# Patient Record
Sex: Female | Born: 1940 | Race: Black or African American | Hispanic: No | Marital: Married | Smoking: Current every day smoker
Health system: Southern US, Community
[De-identification: ages and names within clinical notes are randomized; demographics above are authoritative.]

## PROBLEM LIST (undated history)

## (undated) DIAGNOSIS — M1990 Unspecified osteoarthritis, unspecified site: Secondary | ICD-10-CM

## (undated) DIAGNOSIS — E785 Hyperlipidemia, unspecified: Secondary | ICD-10-CM

## (undated) DIAGNOSIS — I10 Essential (primary) hypertension: Secondary | ICD-10-CM

## (undated) HISTORY — PX: REDUCTION MAMMAPLASTY: SUR839

## (undated) HISTORY — PX: COLONOSCOPY: SHX174

## (undated) HISTORY — PX: FRACTURE SURGERY: SHX138

## (undated) HISTORY — PX: BACK SURGERY: SHX140

## (undated) HISTORY — PX: ABDOMINAL HYSTERECTOMY: SHX81

---

## 1998-08-04 ENCOUNTER — Ambulatory Visit (HOSPITAL_COMMUNITY): Admission: RE | Admit: 1998-08-04 | Discharge: 1998-08-04 | Payer: Self-pay | Admitting: Cardiology

## 1998-08-04 ENCOUNTER — Encounter: Payer: Self-pay | Admitting: Cardiology

## 1998-11-09 ENCOUNTER — Ambulatory Visit (HOSPITAL_BASED_OUTPATIENT_CLINIC_OR_DEPARTMENT_OTHER): Admission: RE | Admit: 1998-11-09 | Discharge: 1998-11-09 | Payer: Self-pay | Admitting: Specialist

## 1999-02-01 ENCOUNTER — Emergency Department (HOSPITAL_COMMUNITY): Admission: EM | Admit: 1999-02-01 | Discharge: 1999-02-01 | Payer: Self-pay | Admitting: Emergency Medicine

## 1999-04-09 ENCOUNTER — Encounter: Payer: Self-pay | Admitting: Specialist

## 1999-04-09 ENCOUNTER — Ambulatory Visit (HOSPITAL_COMMUNITY): Admission: RE | Admit: 1999-04-09 | Discharge: 1999-04-09 | Payer: Self-pay | Admitting: Specialist

## 1999-04-24 ENCOUNTER — Encounter: Payer: Self-pay | Admitting: Specialist

## 1999-04-24 ENCOUNTER — Ambulatory Visit (HOSPITAL_COMMUNITY): Admission: RE | Admit: 1999-04-24 | Discharge: 1999-04-24 | Payer: Self-pay | Admitting: Specialist

## 1999-05-08 ENCOUNTER — Encounter: Payer: Self-pay | Admitting: Specialist

## 1999-05-08 ENCOUNTER — Ambulatory Visit (HOSPITAL_COMMUNITY): Admission: RE | Admit: 1999-05-08 | Discharge: 1999-05-08 | Payer: Self-pay | Admitting: Specialist

## 1999-05-29 ENCOUNTER — Observation Stay (HOSPITAL_COMMUNITY): Admission: RE | Admit: 1999-05-29 | Discharge: 1999-05-30 | Payer: Self-pay | Admitting: Specialist

## 1999-05-29 ENCOUNTER — Encounter (INDEPENDENT_AMBULATORY_CARE_PROVIDER_SITE_OTHER): Payer: Self-pay | Admitting: Specialist

## 1999-05-29 ENCOUNTER — Encounter: Payer: Self-pay | Admitting: Specialist

## 1999-07-31 ENCOUNTER — Ambulatory Visit (HOSPITAL_COMMUNITY): Admission: RE | Admit: 1999-07-31 | Discharge: 1999-07-31 | Payer: Self-pay | Admitting: Specialist

## 2000-07-01 ENCOUNTER — Encounter: Admission: RE | Admit: 2000-07-01 | Discharge: 2000-07-01 | Payer: Self-pay | Admitting: Specialist

## 2000-07-01 ENCOUNTER — Encounter: Payer: Self-pay | Admitting: Specialist

## 2000-09-23 HISTORY — PX: FOOT ARTHRODESIS, MODIFIED MCBRIDE: SUR52

## 2000-10-02 ENCOUNTER — Ambulatory Visit (HOSPITAL_BASED_OUTPATIENT_CLINIC_OR_DEPARTMENT_OTHER): Admission: RE | Admit: 2000-10-02 | Discharge: 2000-10-02 | Payer: Self-pay | Admitting: *Deleted

## 2000-12-11 ENCOUNTER — Ambulatory Visit (HOSPITAL_BASED_OUTPATIENT_CLINIC_OR_DEPARTMENT_OTHER): Admission: RE | Admit: 2000-12-11 | Discharge: 2000-12-11 | Payer: Self-pay | Admitting: *Deleted

## 2001-07-03 ENCOUNTER — Ambulatory Visit (HOSPITAL_COMMUNITY): Admission: RE | Admit: 2001-07-03 | Discharge: 2001-07-03 | Payer: Self-pay | Admitting: Obstetrics & Gynecology

## 2002-06-23 ENCOUNTER — Ambulatory Visit (HOSPITAL_COMMUNITY): Admission: RE | Admit: 2002-06-23 | Discharge: 2002-06-23 | Payer: Self-pay | Admitting: Specialist

## 2003-06-27 ENCOUNTER — Ambulatory Visit (HOSPITAL_COMMUNITY): Admission: RE | Admit: 2003-06-27 | Discharge: 2003-06-27 | Payer: Self-pay | Admitting: Specialist

## 2003-06-27 ENCOUNTER — Encounter: Payer: Self-pay | Admitting: Specialist

## 2004-03-13 ENCOUNTER — Inpatient Hospital Stay (HOSPITAL_COMMUNITY): Admission: RE | Admit: 2004-03-13 | Discharge: 2004-03-19 | Payer: Self-pay | Admitting: Cardiology

## 2004-06-27 ENCOUNTER — Ambulatory Visit (HOSPITAL_COMMUNITY): Admission: RE | Admit: 2004-06-27 | Discharge: 2004-06-27 | Payer: Self-pay | Admitting: Cardiology

## 2004-09-19 ENCOUNTER — Encounter (HOSPITAL_COMMUNITY): Admission: RE | Admit: 2004-09-19 | Discharge: 2004-12-18 | Payer: Self-pay | Admitting: Cardiology

## 2004-10-24 ENCOUNTER — Ambulatory Visit (HOSPITAL_COMMUNITY): Admission: RE | Admit: 2004-10-24 | Discharge: 2004-10-24 | Payer: Self-pay | Admitting: Cardiology

## 2004-11-08 ENCOUNTER — Other Ambulatory Visit: Admission: RE | Admit: 2004-11-08 | Discharge: 2004-11-08 | Payer: Self-pay | Admitting: Interventional Radiology

## 2004-11-08 ENCOUNTER — Encounter: Admission: RE | Admit: 2004-11-08 | Discharge: 2004-11-08 | Payer: Self-pay | Admitting: Cardiology

## 2004-11-08 ENCOUNTER — Encounter (INDEPENDENT_AMBULATORY_CARE_PROVIDER_SITE_OTHER): Payer: Self-pay | Admitting: *Deleted

## 2005-01-18 ENCOUNTER — Observation Stay (HOSPITAL_COMMUNITY): Admission: RE | Admit: 2005-01-18 | Discharge: 2005-01-19 | Payer: Self-pay | Admitting: General Surgery

## 2005-01-18 ENCOUNTER — Encounter (INDEPENDENT_AMBULATORY_CARE_PROVIDER_SITE_OTHER): Payer: Self-pay | Admitting: Specialist

## 2005-03-29 ENCOUNTER — Encounter: Admission: RE | Admit: 2005-03-29 | Discharge: 2005-03-29 | Payer: Self-pay | Admitting: Cardiology

## 2005-06-28 ENCOUNTER — Ambulatory Visit (HOSPITAL_COMMUNITY): Admission: RE | Admit: 2005-06-28 | Discharge: 2005-06-28 | Payer: Self-pay | Admitting: Cardiology

## 2006-04-02 ENCOUNTER — Emergency Department (HOSPITAL_COMMUNITY): Admission: EM | Admit: 2006-04-02 | Discharge: 2006-04-02 | Payer: Self-pay | Admitting: Emergency Medicine

## 2006-04-04 ENCOUNTER — Encounter: Admission: RE | Admit: 2006-04-04 | Discharge: 2006-04-04 | Payer: Self-pay | Admitting: Cardiology

## 2006-06-30 ENCOUNTER — Ambulatory Visit (HOSPITAL_COMMUNITY): Admission: RE | Admit: 2006-06-30 | Discharge: 2006-06-30 | Payer: Self-pay | Admitting: Cardiology

## 2007-03-14 ENCOUNTER — Emergency Department (HOSPITAL_COMMUNITY): Admission: EM | Admit: 2007-03-14 | Discharge: 2007-03-14 | Payer: Self-pay | Admitting: Emergency Medicine

## 2007-07-03 ENCOUNTER — Ambulatory Visit (HOSPITAL_COMMUNITY): Admission: RE | Admit: 2007-07-03 | Discharge: 2007-07-03 | Payer: Self-pay | Admitting: Cardiology

## 2008-02-04 ENCOUNTER — Ambulatory Visit (HOSPITAL_BASED_OUTPATIENT_CLINIC_OR_DEPARTMENT_OTHER): Admission: RE | Admit: 2008-02-04 | Discharge: 2008-02-04 | Payer: Self-pay | Admitting: Orthopedic Surgery

## 2008-04-23 HISTORY — PX: SHOULDER ARTHROSCOPY: SHX128

## 2008-07-04 ENCOUNTER — Ambulatory Visit (HOSPITAL_COMMUNITY): Admission: RE | Admit: 2008-07-04 | Discharge: 2008-07-04 | Payer: Self-pay | Admitting: Obstetrics and Gynecology

## 2008-08-24 ENCOUNTER — Encounter: Admission: RE | Admit: 2008-08-24 | Discharge: 2008-08-24 | Payer: Self-pay | Admitting: Orthopedic Surgery

## 2008-09-23 HISTORY — PX: TOTAL HIP ARTHROPLASTY: SHX124

## 2009-04-17 ENCOUNTER — Encounter: Admission: RE | Admit: 2009-04-17 | Discharge: 2009-04-17 | Payer: Self-pay | Admitting: Orthopedic Surgery

## 2009-05-19 ENCOUNTER — Ambulatory Visit (HOSPITAL_COMMUNITY): Admission: RE | Admit: 2009-05-19 | Discharge: 2009-05-19 | Payer: Self-pay | Admitting: Orthopedic Surgery

## 2009-07-10 ENCOUNTER — Ambulatory Visit (HOSPITAL_COMMUNITY): Admission: RE | Admit: 2009-07-10 | Discharge: 2009-07-10 | Payer: Self-pay | Admitting: Specialist

## 2009-10-23 ENCOUNTER — Encounter: Admission: RE | Admit: 2009-10-23 | Discharge: 2009-10-23 | Payer: Self-pay | Admitting: Cardiology

## 2009-12-12 ENCOUNTER — Encounter: Admission: RE | Admit: 2009-12-12 | Discharge: 2009-12-12 | Payer: Self-pay | Admitting: Orthopedic Surgery

## 2010-01-30 ENCOUNTER — Encounter: Admission: RE | Admit: 2010-01-30 | Discharge: 2010-01-30 | Payer: Self-pay | Admitting: Orthopedic Surgery

## 2010-04-23 HISTORY — PX: TOTAL HIP ARTHROPLASTY: SHX124

## 2010-05-10 ENCOUNTER — Inpatient Hospital Stay (HOSPITAL_COMMUNITY)
Admission: RE | Admit: 2010-05-10 | Discharge: 2010-05-14 | Payer: Self-pay | Source: Home / Self Care | Admitting: Orthopedic Surgery

## 2010-08-23 ENCOUNTER — Ambulatory Visit (HOSPITAL_COMMUNITY)
Admission: RE | Admit: 2010-08-23 | Discharge: 2010-08-23 | Payer: Self-pay | Source: Home / Self Care | Admitting: Cardiology

## 2010-10-14 ENCOUNTER — Encounter: Payer: Self-pay | Admitting: Cardiology

## 2010-10-14 ENCOUNTER — Encounter: Payer: Self-pay | Admitting: Orthopedic Surgery

## 2010-10-15 ENCOUNTER — Encounter: Payer: Self-pay | Admitting: Orthopedic Surgery

## 2010-12-06 LAB — BASIC METABOLIC PANEL
BUN: 9 mg/dL (ref 6–23)
Calcium: 8.6 mg/dL (ref 8.4–10.5)
Calcium: 8.7 mg/dL (ref 8.4–10.5)
Creatinine, Ser: 0.96 mg/dL (ref 0.4–1.2)
GFR calc non Af Amer: 50 mL/min — ABNORMAL LOW (ref >60)
GFR calc non Af Amer: 58 mL/min — ABNORMAL LOW (ref >60)
GFR calc non Af Amer: 58 mL/min — ABNORMAL LOW (ref >60)
Glucose, Bld: 120 mg/dL — ABNORMAL HIGH (ref 70–99)
Glucose, Bld: 165 mg/dL — ABNORMAL HIGH (ref 70–99)
Potassium: 3.8 mEq/L (ref 3.5–5.1)
Potassium: 4.4 mEq/L (ref 3.5–5.1)
Sodium: 135 mEq/L (ref 135–145)
Sodium: 141 mEq/L (ref 135–145)

## 2010-12-06 LAB — CBC
HCT: 30.2 % — ABNORMAL LOW (ref 36.0–46.0)
HCT: 31.2 % — ABNORMAL LOW (ref 36.0–46.0)
HCT: 35 % — ABNORMAL LOW (ref 36.0–46.0)
Hemoglobin: 10.4 g/dL — ABNORMAL LOW (ref 12.0–15.0)
Hemoglobin: 10.9 g/dL — ABNORMAL LOW (ref 12.0–15.0)
Hemoglobin: 11.3 g/dL — ABNORMAL LOW (ref 12.0–15.0)
MCHC: 33.8 g/dL (ref 30.0–36.0)
MCHC: 34.2 g/dL (ref 30.0–36.0)
MCHC: 34.8 g/dL (ref 30.0–36.0)
RBC: 3.42 MIL/uL — ABNORMAL LOW (ref 3.87–5.11)
RDW: 13.8 % (ref 11.5–15.5)
RDW: 14 % (ref 11.5–15.5)
RDW: 14.1 % (ref 11.5–15.5)
WBC: 11.1 10*3/uL — ABNORMAL HIGH (ref 4.0–10.5)

## 2010-12-06 LAB — DIFFERENTIAL
Basophils Absolute: 0 10*3/uL (ref 0.0–0.1)
Lymphocytes Relative: 13 % (ref 12–46)
Monocytes Absolute: 0.8 10*3/uL (ref 0.1–1.0)
Monocytes Relative: 7 % (ref 3–12)
Neutro Abs: 8.8 10*3/uL — ABNORMAL HIGH (ref 1.7–7.7)
Neutrophils Relative %: 79 % — ABNORMAL HIGH (ref 43–77)

## 2010-12-06 LAB — URINALYSIS, MICROSCOPIC ONLY
Nitrite: NEGATIVE
Protein, ur: NEGATIVE mg/dL
Urobilinogen, UA: 0.2 mg/dL (ref 0.0–1.0)

## 2010-12-06 LAB — URINALYSIS, ROUTINE W REFLEX MICROSCOPIC
Glucose, UA: NEGATIVE mg/dL
Hgb urine dipstick: NEGATIVE
Protein, ur: NEGATIVE mg/dL
pH: 6 (ref 5.0–8.0)

## 2010-12-06 LAB — URINE CULTURE

## 2010-12-06 LAB — CROSSMATCH
ABO/RH(D): B POS
Antibody Screen: NEGATIVE

## 2010-12-07 LAB — COMPREHENSIVE METABOLIC PANEL
ALT: 26 U/L (ref 0–35)
AST: 27 U/L (ref 0–37)
Alkaline Phosphatase: 67 U/L (ref 39–117)
CO2: 25 mEq/L (ref 19–32)
GFR calc Af Amer: >60 mL/min (ref >60)
GFR calc non Af Amer: >60 mL/min (ref >60)
Glucose, Bld: 84 mg/dL (ref 70–99)
Potassium: 3.8 mEq/L (ref 3.5–5.1)
Sodium: 141 mEq/L (ref 135–145)

## 2010-12-07 LAB — CBC
HCT: 44.1 % (ref 36.0–46.0)
Hemoglobin: 15.2 g/dL — ABNORMAL HIGH (ref 12.0–15.0)
RBC: 4.79 MIL/uL (ref 3.87–5.11)
WBC: 7.4 10*3/uL (ref 4.0–10.5)

## 2010-12-07 LAB — PROTIME-INR
INR: 1.05 (ref 0.00–1.49)
Prothrombin Time: 13.9 seconds (ref 11.6–15.2)

## 2010-12-07 LAB — DIFFERENTIAL
Basophils Relative: 1 % (ref 0–1)
Eosinophils Absolute: 0.3 10*3/uL (ref 0.0–0.7)
Eosinophils Relative: 4 % (ref 0–5)
Lymphs Abs: 2.6 10*3/uL (ref 0.7–4.0)
Monocytes Relative: 7 % (ref 3–12)
Neutrophils Relative %: 52 % (ref 43–77)

## 2010-12-07 LAB — URINALYSIS, ROUTINE W REFLEX MICROSCOPIC
Glucose, UA: NEGATIVE mg/dL
Nitrite: NEGATIVE
Specific Gravity, Urine: 1.018 (ref 1.005–1.030)
pH: 5 (ref 5.0–8.0)

## 2010-12-07 LAB — SURGICAL PCR SCREEN: MRSA, PCR: NEGATIVE

## 2011-01-04 ENCOUNTER — Other Ambulatory Visit: Payer: Self-pay | Admitting: Orthopedic Surgery

## 2011-01-04 ENCOUNTER — Encounter (HOSPITAL_COMMUNITY): Payer: MEDICARE

## 2011-01-04 DIAGNOSIS — Z01818 Encounter for other preprocedural examination: Secondary | ICD-10-CM | POA: Insufficient documentation

## 2011-01-04 DIAGNOSIS — Z01812 Encounter for preprocedural laboratory examination: Secondary | ICD-10-CM | POA: Insufficient documentation

## 2011-01-04 LAB — PROTIME-INR: Prothrombin Time: 13.3 seconds (ref 11.6–15.2)

## 2011-01-04 LAB — COMPREHENSIVE METABOLIC PANEL
ALT: 19 U/L (ref 0–35)
Albumin: 4 g/dL (ref 3.5–5.2)
Calcium: 9.3 mg/dL (ref 8.4–10.5)
GFR calc Af Amer: >60 mL/min (ref >60)
Glucose, Bld: 81 mg/dL (ref 70–99)
Potassium: 3.5 mEq/L (ref 3.5–5.1)
Sodium: 138 mEq/L (ref 135–145)
Total Protein: 6.8 g/dL (ref 6.0–8.3)

## 2011-01-04 LAB — DIFFERENTIAL
Eosinophils Relative: 3 % (ref 0–5)
Lymphocytes Relative: 35 % (ref 12–46)
Monocytes Absolute: 0.6 10*3/uL (ref 0.1–1.0)
Monocytes Relative: 8 % (ref 3–12)
Neutro Abs: 4.2 10*3/uL (ref 1.7–7.7)

## 2011-01-04 LAB — URINE MICROSCOPIC-ADD ON

## 2011-01-04 LAB — SURGICAL PCR SCREEN: Staphylococcus aureus: POSITIVE — AB

## 2011-01-04 LAB — CBC
HCT: 43.3 % (ref 36.0–46.0)
Hemoglobin: 14.5 g/dL (ref 12.0–15.0)
MCV: 90 fL (ref 78.0–100.0)
RDW: 13.9 % (ref 11.5–15.5)
WBC: 7.9 10*3/uL (ref 4.0–10.5)

## 2011-01-04 LAB — URINALYSIS, ROUTINE W REFLEX MICROSCOPIC
Bilirubin Urine: NEGATIVE
Glucose, UA: NEGATIVE mg/dL
Hgb urine dipstick: NEGATIVE
Ketones, ur: NEGATIVE mg/dL
Specific Gravity, Urine: 1.025 (ref 1.005–1.030)
pH: 5.5 (ref 5.0–8.0)

## 2011-01-04 LAB — APTT: aPTT: 33 seconds (ref 24–37)

## 2011-01-10 ENCOUNTER — Inpatient Hospital Stay (HOSPITAL_COMMUNITY)
Admission: RE | Admit: 2011-01-10 | Discharge: 2011-01-16 | DRG: 470 | Disposition: A | Discharge status: Skilled Nursing Facility | Payer: MEDICARE | Source: Ambulatory Visit | Attending: Orthopedic Surgery | Admitting: Orthopedic Surgery

## 2011-01-10 ENCOUNTER — Inpatient Hospital Stay (HOSPITAL_COMMUNITY): Payer: MEDICARE

## 2011-01-10 DIAGNOSIS — N390 Urinary tract infection, site not specified: Secondary | ICD-10-CM | POA: Diagnosis present

## 2011-01-10 DIAGNOSIS — Z96649 Presence of unspecified artificial hip joint: Secondary | ICD-10-CM

## 2011-01-10 DIAGNOSIS — I10 Essential (primary) hypertension: Secondary | ICD-10-CM | POA: Diagnosis present

## 2011-01-10 DIAGNOSIS — E7800 Pure hypercholesterolemia, unspecified: Secondary | ICD-10-CM | POA: Diagnosis present

## 2011-01-10 DIAGNOSIS — M169 Osteoarthritis of hip, unspecified: Principal | ICD-10-CM | POA: Diagnosis present

## 2011-01-10 DIAGNOSIS — D62 Acute posthemorrhagic anemia: Secondary | ICD-10-CM | POA: Diagnosis not present

## 2011-01-10 DIAGNOSIS — Y831 Surgical operation with implant of artificial internal device as the cause of abnormal reaction of the patient, or of later complication, without mention of misadventure at the time of the procedure: Secondary | ICD-10-CM | POA: Diagnosis not present

## 2011-01-10 DIAGNOSIS — Z01812 Encounter for preprocedural laboratory examination: Secondary | ICD-10-CM

## 2011-01-10 DIAGNOSIS — T84049A Periprosthetic fracture around unspecified internal prosthetic joint, initial encounter: Secondary | ICD-10-CM | POA: Diagnosis not present

## 2011-01-10 DIAGNOSIS — F17200 Nicotine dependence, unspecified, uncomplicated: Secondary | ICD-10-CM | POA: Diagnosis present

## 2011-01-10 DIAGNOSIS — K5900 Constipation, unspecified: Secondary | ICD-10-CM | POA: Diagnosis not present

## 2011-01-10 DIAGNOSIS — M1610 Unilateral primary osteoarthritis, unspecified hip: Principal | ICD-10-CM | POA: Diagnosis present

## 2011-01-10 LAB — URINALYSIS, ROUTINE W REFLEX MICROSCOPIC
Hgb urine dipstick: NEGATIVE
Specific Gravity, Urine: 1.011 (ref 1.005–1.030)
Urobilinogen, UA: 0.2 mg/dL (ref 0.0–1.0)

## 2011-01-11 ENCOUNTER — Inpatient Hospital Stay (HOSPITAL_COMMUNITY): Payer: MEDICARE

## 2011-01-11 LAB — CBC
HCT: 32 % — ABNORMAL LOW (ref 36.0–46.0)
Hemoglobin: 10.8 g/dL — ABNORMAL LOW (ref 12.0–15.0)
MCH: 30.3 pg (ref 26.0–34.0)
MCHC: 33.8 g/dL (ref 30.0–36.0)
MCV: 89.6 fL (ref 78.0–100.0)

## 2011-01-11 LAB — BASIC METABOLIC PANEL
CO2: 28 mEq/L (ref 19–32)
Calcium: 8.6 mg/dL (ref 8.4–10.5)
Creatinine, Ser: 1.14 mg/dL (ref 0.4–1.2)
Glucose, Bld: 127 mg/dL — ABNORMAL HIGH (ref 70–99)

## 2011-01-11 LAB — URINE CULTURE: Culture: NO GROWTH

## 2011-01-12 ENCOUNTER — Inpatient Hospital Stay (HOSPITAL_COMMUNITY): Payer: MEDICARE

## 2011-01-12 LAB — CBC
HCT: 29.3 % — ABNORMAL LOW (ref 36.0–46.0)
Hemoglobin: 9.8 g/dL — ABNORMAL LOW (ref 12.0–15.0)
RDW: 14.1 % (ref 11.5–15.5)
WBC: 12.9 10*3/uL — ABNORMAL HIGH (ref 4.0–10.5)

## 2011-01-12 LAB — BASIC METABOLIC PANEL
CO2: 26 mEq/L (ref 19–32)
GFR calc Af Amer: >60 mL/min (ref >60)
GFR calc non Af Amer: >60 mL/min (ref >60)
Glucose, Bld: 118 mg/dL — ABNORMAL HIGH (ref 70–99)
Potassium: 3.4 mEq/L — ABNORMAL LOW (ref 3.5–5.1)
Sodium: 137 mEq/L (ref 135–145)

## 2011-01-13 LAB — CBC
HCT: 27.9 % — ABNORMAL LOW (ref 36.0–46.0)
Hemoglobin: 9.5 g/dL — ABNORMAL LOW (ref 12.0–15.0)
MCHC: 34.1 g/dL (ref 30.0–36.0)
RBC: 3.13 MIL/uL — ABNORMAL LOW (ref 3.87–5.11)

## 2011-01-13 LAB — URINALYSIS, ROUTINE W REFLEX MICROSCOPIC
Glucose, UA: 100 mg/dL — AB
Protein, ur: NEGATIVE mg/dL
Specific Gravity, Urine: 1.024 (ref 1.005–1.030)
pH: 5.5 (ref 5.0–8.0)

## 2011-01-13 LAB — URINE MICROSCOPIC-ADD ON

## 2011-01-14 LAB — CROSSMATCH: Unit division: 0

## 2011-01-22 NOTE — Discharge Summary (Signed)
[name], [name]   ACCOUNT NO.:  [id number]  MEDICAL RECORD NO.:  [id number]           PATIENT TYPE:  I  LOCATION:  1610                         FACILITY:  [facility]  PHYSICIAN:  Adyline Huberty L. Rendall, M.D.  DATE OF BIRTH:  Mar 27, 1941  DATE OF ADMISSION:  01/10/2011 DATE OF DISCHARGE:                              DISCHARGE SUMMARY   ADMISSION DIAGNOSES: 1. End-stage osteoarthritis right hip. 2. History of left total hip arthroplasty. 3. Hypertension. 4. Hypercholesterolemia.  DISCHARGE DIAGNOSES: 1. End-stage osteoarthritis, right hip, status post right total hip     arthroplasty. 2. Nondisplaced distal femur fracture distal to the prosthesis,     nondisplaced. 3. Acute blood loss anemia secondary to surgery. 4. Constipation, now resolved. 5. Preop and now postop urinary tract infection. 6. History of left total hip arthroplasty. 7. Hypertension. 8. Hypercholesterolemia.  SURGICAL PROCEDURES:  On January 10, 2011, [name] underwent a right total hip arthroplasty by Dr. Jonny Ruiz L. Rendall assisted by Arnoldo Morale PA-C.  She had a Pinnacle acetabular 100 series shell placed, 52 mm outer diameter, with an apex hole eliminator and a pinnacle marathon poly acetabular liner plus 4, 10 degrees, 36 mm inner diameter, 52 mm outer diameter.  A Prodigy femoral stem, 12/14 taper, small stature with Porocoat 16.5 mm right with a metal-on-metal femoral head 12/14 taper +5 neck length 36 mm.  COMPLICATIONS:  A small nondisplaced crack was noted in the distal femur just distal to the prosthesis on the postop films and was confirmed with repeat films.  CONSULTANT: 1. Physical Therapy consult on January 11, 2011. 2. Occupational Therapy consult on January 12, 2011. 3. Case management consult, January 12, 2011.  HISTORY OF PRESENT ILLNESS:  This 70 year old black female patient with a history of a left total hip last August presented with a 22-month history of sudden onset  progressive right hip pain without injury.  Pain is now constant, sharp sensation over the right trochanter and groin with some radiation into the knee.  Pain increases with prolonged walking and decreases with nothing.  The hip pops, gives way, she cannot lie on the right side.  She has failed conservative treatment and x-rays showed arthritic changes.  Because of this, she is presenting for a right hip replacement.  HOSPITAL COURSE:  [name] tolerated her surgical procedure without immediate postoperative complications.  On the postop x-ray, Radiology felt there might be a small nondisplaced crack at the distal femur.  We did look at the x-rays and it was unclear.  Repeat films were ordered over the next several days and it did show what appears to be a nondisplaced distal femur fracture line.  Prosthesis otherwise looks in good position.  On postop day #1, T max was 100.4, vitals were stable.  Hemoglobin 10.8, hematocrit 32.  Her repeat UA when the Foley was placed was negative fora UTI.  She had had one preoperatively treated with Macrobid.  She was weaned off her oxygen and switched to the p.o. Nucynta and started on therapy per protocol.  She did have difficulty with therapy that day, so her weightbearing status was switched to touchdown weightbearing.  On postop day #2, it was noted she  had some right knee pain and swelling.  X-rays were taken of the knee and hip.  The fracture of the distal femur was confirmed but there was no abnormality noted about the patella.  Her T-max was 101.3, hemoglobin 8.5, hematocrit 29.3.  She was continued on therapy but not much was done due to touchdown weightbearing status.  On postop day #3, she had continued mild fevers.  Nucynta was doing well for pain but a repeat UA was obtained.  It did show a possible re- emergence of UTI.  She was started on Septra and then switched back to her Macrobid.  Plans were made for possible  skilled placement.  The patient was not real receptive to that at first so an attempt was made to advancing therapy before discharge.  The next several days, she made very slow progress.  She still had a moderate amount of pain, when she got out of bed could do very little with therapy.  She is to be touchdown weightbearing and is able to ambulate a few feet at the time with the walker.  Her right hip incision was well approximated with staples, moderate serosanguineous drainage. She remains afebrile, vitals stable, tolerating Nucynta and Macrobid well.  It is felt she is ready for DC probably to a skilled facility later today.  DISCHARGE INSTRUCTIONS:  DIET:  She is to resume her regular diet.  MEDICATIONS:  Please see the home discharge med rec sheet for complete documentation of her medications.  She has multiple allergies.  New meds that we added were Robaxin, Macrobid, Xarelto, tapentadol for pain.  The rest for meds are noted on the discharge rec sheet.  ACTIVITY:  She is to be touchdown weightbearing only on the right leg with use of walker.  No twisting of the leg is to be done.  She is to have PT per rehab protocols.  No lifting or driving for 6 to 8 weeks.  Please see the white total joint discharge sheet for further activity instructions.  WOUND CARE:  If the Mepilex stays clean and intact, it can stay in place for another 2 to 3 days.  When that comes off, the incision is to be cleaned with Betadine and a dry dressing applied.  Notify Dr. Priscille Kluver of any signs of infection.  Please see the white total joint discharge sheet for further wound care instructions.  FOLLOWUP:  She is to follow up with Dr. Priscille Kluver in our office on Thursday Jan 24, 2011, and she needs to call [phone] for that appointment.  She is arranged to have [facility] at the time of discharge for her home health needs that has been arranged previously.  LABORATORY DATA:  Hemoglobin and  hematocrit ranged from 10.8 and 32 on the 20th to a low of 9.5 and 27.9 on the 22nd to 10.8 and 32 on the 20th.  White count went from 7.4 on the 20th to 13 on the 22nd. Platelets were within normal limits.  Potassium dropped to a low of 3.4 on the 21st.  Glucose ranged from 118 to 127.  BUN and creatinine were within normal limits and estimated GFR was low at 57 on the 20th and then went to greater than 60.  All other laboratory studies were within normal limits.  RADIOLOGIC FINDINGS:  X-rays taken of the right knee on the 21st showed no fracture or dislocation, little bit of degenerative spurring.  The right femur films on the 20th and also the  21st showed a probable linear lucency projecting distally from the distal stem of the right femoral implant, they feel this could be a nondisplaced fracture.  All other laboratory studies were within normal limits.     Legrand Pitts Duffy, P.A.   ______________________________ Carlisle Beers. Priscille Kluver, M.D.    KED/MEDQ  D:  01/15/2011  T:  01/15/2011  Job:  [id number]  Electronically Signed by Otilio Jefferson. on 01/17/2011 04:58:13 PM Electronically Signed by Erasmo Leventhal M.D. on 01/22/2011 01:08:07 PM

## 2011-01-22 NOTE — Op Note (Signed)
[name], [name]   ACCOUNT NO.:  [id number]  MEDICAL RECORD NO.:  [id number]           PATIENT TYPE:  I  LOCATION:  1610                         FACILITY:  [facility]  PHYSICIAN:  Laneah Luft L. Rendall, M.D.  DATE OF BIRTH:  1941/09/19  DATE OF PROCEDURE:  01/10/2011 DATE OF DISCHARGE:                              OPERATIVE REPORT   PREOPERATIVE DIAGNOSIS:  Osteoarthritis, right hip.  POSTOPERATIVE DIAGNOSIS:  Osteoarthritis, right hip.  SURGICAL PROCEDURE:  Right AML total hip arthroplasty.  SURGEON:  Peaches Vanoverbeke L. Rendall, M.D.  ASSISTANT:  Legrand Pitts. Duffy, P.A. present and participating in entire procedure.  PATHOLOGY:  Bone against bone, right hip.  Significant pain despite all conservative measures including injection.  JUSTIFICATION FOR INPATIENT SETTING:  Need for general anesthesia and therapy.  DESCRIPTION OF PROCEDURE:  Under general anesthetic, the right hip was prepared with DuraPrep and draped as a sterile field in a left lateral decubitus position.  Posterior approach was made splitting the IT band and the subcutaneous tissue in line of the IT band fibers.  A 4-inch fatty layer was encountered.  A deep Charnley retractor was inserted. The short external rotators and hip capsule were taken down from bone with electrocautery.  Care was taken to control bleeding.  Hip capsule was then opened in a T-shaped manner, and the hip was dislocated.  The superior femoral neck was exposed and an IM initiator and canal finder were used.  Progressive reaming up to 16 mm was then done.  The guide was then used and the femoral neck osteotomized.  Rasps were then used 13.5 small, 15 small and 16.5 small which gave excellent proximal and distal fit.  Calcar reamer had been used at the 15-mm rasp.  Attention was then turned to the acetabulum.  It was exposed inferiorly with 2 cobra retractors, superiorly with 2 wing retractors.  The labrum was excised.  Ligamentum teres was  excised.  Progressive reaming was then done 43, 45, 47, 49 and 51.  A 50 acetabular component bottomed out nicely and seemed to have relatively good rim fit.  Permanent component was then obtained, 52 mm, 100 series pinnacle cup and an excellent press fit was obtained using the spud neck external guide and also using the natural rim of the acetabulum as a secondary guide.  With this in place, a trial acetabular poly was then inserted. A +4, 10 degree with 36 mm inside, 52 mm outside diameter.  This was inserted.  A trial of hip ball off a rasp was then done and after several combinations were tried, the +5 neck length on a prodigy neck gave the best fit, alignment and stability through all normal range of motion.  Permanent components were then obtained, and the marathon polyethylene acetabular liner +4, 10 degree was then inserted after an apex hole eliminator.  Prodigy stem was then inserted.  Trial was then done again with the +5, 36-mm hip ball and with the real stem in place, an excellent fit was obtained, so the permanent metal hip ball was then inserted.  The hip was stable through full range of motion.  The wound was then irrigated and closed in layers.  The  labrum was reconstructed with #1 Tycron, the piriformis was reattached with #1 Tycron, loose attempt was made to reattach short external rotators and then attention was turned to the IT band which was closed with #1 Tycron.  The 4-inch fatty layer was closed in 3 layers with #1 Vicryl, 2 layers and 2-0 Vicryl, 1 layer and skin clips.  Operative time an hour and 10 minutes. Blood loss estimated at less than 350.  The patient tolerated the procedure well and returned to recovery in good condition.     Sulaiman Imbert L. Priscille Kluver, M.D.     Renato Gails  D:  01/10/2011  T:  01/10/2011  Job:  [id number]  Electronically Signed by Erasmo Leventhal M.D. on 01/22/2011 01:08:04 PM

## 2011-02-05 NOTE — Op Note (Signed)
[name], [name]   ACCOUNT NO.:  [id number]   MEDICAL RECORD NO.:  [id number]          PATIENT TYPE:  AMB   LOCATION:  DSC                          FACILITY:  [facility]   PHYSICIAN:  Rodney A. Mortenson, M.D.DATE OF BIRTH:  1940-09-25   DATE OF PROCEDURE:  02/04/2008  DATE OF DISCHARGE:                               OPERATIVE REPORT   JUSTIFICATION:  This is a 70 year old female who has had trouble of the  shoulder since January 2009.  She has followed with Dr. Burnadette Peter, treated  with subacromial injections.  These have been partially beneficial.  As  she increases her activities, these are no longer helpful.  She is  having significant pain with the shoulder now and an MRI was done.  This  shows significant rotator cuff tendinopathy and tendinosis.  There was  fairly extensive and deep bursal surface tears involving the  supraspinatus, although it does not look like a full-thickness tear.  This is type 3 acromion and spurring on the distal end of the clavicle  on inferior surface contributing to bony impingement.  She was now  admitted for arthroscopic evaluation and treatments.  Questions were  answered and encouraged preoperatively.   COMPLICATIONS:  As discussed.   Justification outpatient surgical, minimum morbidity.   PREOPERATIVE DIAGNOSES:  Severe cuff tendinopathy right shoulder with  extensive deep bursal surface tear, type 3 acromion with impingement,  and spurs in the inferior aspect of acromion in distal end of the  clavicle causing bony impingement and irritation of the cuff.   POSTOPERATIVE DIAGNOSES:  Severe cuff tendinopathy right shoulder with  extensive deep bursal surface tear, type 3 acromion with impingement,  and spurs in the inferior aspect of acromion in distal end of the  clavicle causing bony impingement and irritation of the cuff; articular  surface fraying of the supraspinatus, anterior to the biceps with no  full-thickness tear.   OPERATION:  Right shoulder arthroscopy; debridement articular surface,  fraying of supraspinatus; subacromial decompression right acromion, and  debridement of the undersurface of the right distal clavicle, and  removal of bone spurs.   SURGEON:  Lenard Galloway. Chaney Malling, MD   ANESTHESIA:  General.   PROCEDURE:  The patient placed on the operating table in supine  position.  After satisfactory general anesthesia, the patient was placed  in semi-sitting position.  The right upper extremity was prepped with  DuraPrep, draped out in the usual manner.  The arthroscope was  introduced into the standard posterior portal.  Anterior portal was made  also.  A very careful examination of the shoulder was undertaken.  Articular cartilage of the humeral head and the glenoid appeared normal.  There was fraying of the labrum anteriorly.  The biceps was visualized  and this was absolutely normal, and the rotator cuff posterior to the  biceps tendon appeared absolutely normal.  Anterior to the biceps  tendon, there was fraying of the supraspinatus, but no full-thickness  tear could be seen.  Through the anterior portal a full radius,  dissector was inserted in the anterior edge.  The labrum was debrided  and the undersurface of supraspinatus in the area of fraying and tearing  was also debrided.  This was then examined very carefully and no full-  thickness tears were seen and no other significant pathology seen in the  glenohumeral joint.   The arthroscope was removed and placed in the subacromial space.  There  was a great deal of bursal surface fraying and irritation and tendinosis  and tendinopathy.  The undersurface of acromion was markedly frayed and  looked like fronds of wet mop hanging down.  A lateral portal was then  made and the articular wand inserted.  A fair amount of time was spent  with the LifeCare 1 getting rid of the inflamed soft tissue.  Once the  undersurface of the acromion was  clearly defined and at this time, the  clavicle be seen, there were large bone spurs on the undersurface of the  acromion and distal end of the clavicle.  A very careful examination of  the rotator cuff bursal side was done and no tears were seen, but there  was significant tendinosis.  A 4-mm bur was then inserted.  A very  careful, deliberate, fairly aggressive acromioplasty was done, certainly  anteriorly and working posteriorly.  The anterior margin was localized  with an 18-gauge spinal needle.  Dissection was carried posteriorly and  this area was debulked.  Large bone spurs in the inferior surface of  acromion were smoothed off.  The undersurface of the clavicle, the curve  will be seen and bone spurs were seen at this level and this was also  debrided on the undersurface on the bursal side.  The [facility] joint was not  resected.  A fair amount of time was spent decompressing entire area and  smoothen this off.  We were very satisfied with the final reconstruction  and decompression that had occurred.  The arthroscope was removed.  3-0  nylon suture was used to close the skin wounds and sterile dressing was  applied.  The patient returned to recovery room in excellent condition.  Technically, this went extremely well.   DISPOSITION:  1. A blunt sling to be used p.r.n.  2. Remove dressings in 3 days and put on bandage.  3. Sling to be used as needed.  4. To my office next week on Wednesday.      Rodney A. Chaney Malling, M.D.  Electronically Signed     RAM/MEDQ  D:  02/04/2008  T:  02/05/2008  Job:  [id number]

## 2011-02-08 NOTE — Op Note (Signed)
[place]  Patient:    [name], [name]                  MRN: [id number] Proc. Date: 12/11/00 Adm. Date:  [id number] Attending:  Maryanna Shape                           Operative Report  PREOPERATIVE DIAGNOSIS:  Bunion deformity and hallux valgus, right great toe.  POSTOPERATIVE DIAGNOSIS:  Bunion deformity and hallux valgus, right great toe.  OPERATION:  Chevron osteotomy; bunionectomy.  SURGEON:  Reynolds Bowl, M.D.  ANESTHESIA:  General per patients request.  DESCRIPTION OF PROCEDURE:  The patient was given a general anesthetic.  The right lower extremity was prepped and draped in the usual manner for sterility.  I exsanguinated by using Esmarch bandage which was left wrapped snugly over a green towel at the ankle.  The approach was through a straight medial incision at the junction of the dorsal and plantar skin.  This was carried down to the subcutaneous tissues which were reflected up and down. The dorsal and plantar digital nerves were pushed dorsally and plantarward. The joint was opened through a straight incision parallel to the plantar surface.  Then the capsule was dissected off the proximal phalanx enough dorsally and plantarward to expose a very large medial eminance.  This was outlined and excised through the groove of Clark with a fine oscillating saw. Rough edges were rounded with bone nibblers.  I then used a marking pen and marked the center of the metatarsal head, made a drill hole through that, then used that as a center axis for oblique chevron osteotomy of the metatarsal.  This was carried out using small microsaws. When the opposite cortex was breached, I was able to grasp the first metatarsal and pull it towards the tibial side and push the metatarsal head to the fibular side approximately 3 mm.  This slipped over very smoothly and seemed to be very stable.  However, I did put one K-wire through it from  the dorsal surface vent, then cut that K-wire and had it rest under the intact skin.  I then used the microsaw to resect back the remaining prominence of the metatarsal as a result of moving the head to the fibular side.  This area was copiously irrigated.  Then I used one nonabsorbable suture past through a drill hole to anchor the plantar flap of capsule and this brought the capsule up towards the dorsal side, thus shifting the sesamoid some and put the great toe over on better alignment.  This then was supported with three other #1 Vicryl sutures that were passed similarly to form an imbricated repair.  It was done such that the excess tissue was dorsally, and this was excised.  This then allowed good range of motion and held the great toe in a very acceptable position.  I used three or four 4-0 Vicryls to tap subcutaneous tissues in approximation and then used a running 5-0 nylon to hold the skin edges and reduce position. Covered this with Xeroform 4x4s and Kling, and wrapped the foot so as to support the great toe in the neutral position, then overwrapped this with an Ace wrap and applied a fracture shoe.  INSTRUCTIONS TO PATIENT:  Take OxyContin 10 mg q.12h. with Vicodin for breakthrough pain.  She is to keep the foot elevated.  Do ankle pumps.  If she has to  ambulate, she will use crutches, and place weight on her heel.  She has had the opposite foot done and requested this foot be done, and she has a good idea of what to expect.  She will call me with any problems, otherwise she will be coming to the office early next week for dressing change. DD:  12/11/00 TD:  12/12/00 Job: [id number]

## 2011-02-08 NOTE — Discharge Summary (Signed)
NAMESAMYIA, MOTTER               ACCOUNT NO.:  [id number]   MEDICAL RECORD NO.:  [id number]                   PATIENT TYPE:  INP   LOCATION:  5727                                 FACILITY:  [facility]   PHYSICIAN:  Osvaldo Shipper. Spruill, M.D.             DATE OF BIRTH:  1941-07-15   DATE OF ADMISSION:  03/13/2004  DATE OF DISCHARGE:  03/19/2004                                 DISCHARGE SUMMARY   DISCHARGE DIAGNOSES:  1. Diverticulitis of the colon without hemorrhage.  2. Urinary tract infection.  3. Blood loss anemia.  4. Hypertension.   CONSULTATIONS:  Lina Sar, M.D.   PROCEDURE:  Small bowel endoscopy on March 19, 2004.   HISTORY OF PRESENT ILLNESS:  [name] is a 70 year old patient who  presented to the [facility] on March 13, 2004, with complaint of  abdominal pain.  This patient developed excruciating left lower quadrant  abdominal pain a few days prior to admission.  The pain had progressed  awakening her from her sleep.  She described the pain as worsening with  bowel movement.  She developed nausea, vomiting and diarrhea on the day of  admission.  She denied any hematemesis and no known hematochezia.  It is of  note that the patient had a colonoscopy approximately 1 week ago at [facility].  She is unsure of the findings at this time.   The admission vital signs were within normal limits.  The patient was noted  to be in some distress.  The abdominal exam revealed the abdomen to be soft  with exquisite left lower quadrant tenderness.  Bowel sounds were noted to  be active.  The patient was subsequently admitted for evaluation of left  lower quadrant pain.  Differential at this point would include diverticular  disease and nephrolithiasis.   HOSPITAL COURSE:  The patient was admitted to the medical service.  She had  a PICC line placed in the right arm.  This was confirmed with chest x-ray  that it was in the SVC.   On June 22, the patient  was seen on GI consultation.  The patient underwent  a CT abdomen with contrast which revealed the liver, spleen, pancreas and  kidneys to be normal.  There was no adenopathy or mass and was read as  negative.  The pelvis portion of the CT, however, revealed sigmoid  diverticulosis without abscess.  After GI consultation, the plan was to  change the patient's antibiotic course to Cipro and Flagyl and have the  patient on clear liquids.  The patient was also noted to have a urinary  tract infection and this to was addressed.   On June 25, the pain began to show some improvement.  The patient was able  to ambulate in the hall.   On June 27, the patient was beginning to improve.  On June 27, she had an  upper endoscopy due to iron deficiency anemia.  She was noted to  have a  small hiatal hernia, but no other lesions were present.   On June 27, it was the opinion of the attending physician as well as the  consulting physician that the patient could be discharged home having  received maximum benefit from this hospitalization.   DISCHARGE MEDICATIONS:  1. Septra DS one two times daily.  2. Flagyl 500 mg three times daily.  3. Iron sulfate 325 mg two times daily.  4. Avalide 150/12.5 one by mouth daily.  5. Levsin 375 one every 12 hours.  6. Lipitor 20 mg daily.  7. Darvocet-N 100 one every 4-6 hours as needed for pain.   FOLLOW UP:  The patient is to be followed up by Iva Boop, M.D. with  [facility] on July 15, at 3:30 p.m.  The patient is to be seen in Dr.  Magda Kiel on July 5.  The patient is to notify the physician immediately if  any changes, problems or concerns.      Ivery Quale, P.A.                       Osvaldo Shipper. Spruill, M.D.    Suella Grove  D:  04/11/2004  T:  04/11/2004  Job:  [id number]

## 2011-02-08 NOTE — Op Note (Signed)
[place]  Patient:    [name], [name]                  MRN: [id number] Proc. Date: 10/02/00 Attending:  Reynolds Bowl, M.D.                           Operative Report  PREOPERATIVE DIAGNOSIS:  Bunion deformity of left foot.  POSTOPERATIVE DIAGNOSIS:  Bunion deformity of left foot.  OPERATION:  Chevron bunionectomy.  SURGEON:  Reynolds Bowl, M.D.  ANESTHESIA:  General.  DESCRIPTION OF PROCEDURE:  The patient foot and lower leg were prepped and draped in the usual manner for sterilely.  The foot was exsanguinated by elevation and use of an Esmarch bandage, which I then left wrapped snugly around the ankle over a green towel.  I outlined a straight mid-medial incision, and carried it down through the superficial skin, following which I identified the dorsal and plantar sensory nerves, and dissected them free, pushed them dorsally, and then with a straight line incision through the capsule at approximately that level, a little more plantar, made a straight incision through the periosteum, through the capsule, and then I dissected up the capsule a little bit dorsally and a little bit volarly enough so as to expose the distal first metatarsal and some of the proximal first phalanx. There were osteophytes dorsally, and this was removed.  I used an osteotome and removed the medial eminence through the groove of Clark.  I then used a K-wire to mark the center of the metatarsal head almost 1.0 cm proximal to the articular surface, and then outlined 60-degree osteotomies.  Then using the small or the micro-saws, formed the osteotomy, carefully, slowly, and all the way through, protecting the soft tissues.  Having completed the osteotomy then, I grasped the first metatarsal gently with a towel clamp, pulled the metatarsal to the tibial side, and pushed the distal fragment to the fibular side, exposing about 3.0 mm of distal first metatarsal shaft,  and this was trimmed back in line with the metatarsal shaft.  The preoperative hallux valgus was 27 degrees.  The first and second metatarsal angle was 13.  The distal phalangeal angle was 15.  Therefore I felt that a slight tilt toward the tibial side was indicated, and resected a small amount of the osteotomy on the tibial side of the metatarsal, so as to cause a little tilt of the first phalanx to the tibial side.  This osteotomy was very stable, and appeared to result in the toe being well-corrected.  I used a 0.45 K-wire through a skin puncture dorsally to plantar, to ensure stability of the osteotomy.  I copiously irrigated the joint.  I resected some of the dorsal part of the capsule, so as to shift the plantar part medially and upward, and this was fixed using several #2-0 and two #1 Vicryl sutures. Prior to this, the wound was copiously irrigated with saline, and after this closure was irrigated with saline.  Two #4-0 Vicryls were used to approximate the subcutaneous tissues lightly.  Then the skin edges were held in that position with multiple #5-0 nylon sutures.  Plain Marcaine was injected about the incision.  Xeroform was placed on the incision.  A bulky dressing was applied, along with a wooden shoe.  INSTRUCTIONS TO PATIENT:  To take Tylox for pain.  Elevate the foot.  Move the ankle frequently.  Ambulate using  crutches, allowing the heel to touch.  See me in the office next week as planned.  To call sooner with concerns. DD:  10/02/00 TD:  10/02/00 Job: [id number]

## 2011-02-08 NOTE — Op Note (Signed)
[name], [name]   ACCOUNT NO.:  [id number]   MEDICAL RECORD NO.:  [id number]          PATIENT TYPE:  AMB   LOCATION:  DAY                          FACILITY:  [facility]   PHYSICIAN:  Leonie Man, M.D.   DATE OF BIRTH:  1941/05/06   DATE OF PROCEDURE:  01/18/2005  DATE OF DISCHARGE:                                 OPERATIVE REPORT   PREOPERATIVE DIAGNOSIS:  Right thyroid nodule, rule out carcinoma.   POSTOPERATIVE DIAGNOSIS:  Right thyroid nodule, rule out carcinoma.   PATHOLOGY:  Pending.   PROCEDURES:  Right thyroid lobectomy and isthmectomy.   SURGEON:  Leonie Man, M.D.   ASSISTANT:  Jerelene Redden, M.D.   ANESTHESIA:  General.   SPECIMENS TO PATHOLOGY:  Right thyroid lobe and isthmus.   ESTIMATED BLOOD LOSS:  Minimal.   COMPLICATIONS:  None. The patient's condition to the PACU is excellent.   HISTORY:  Note, the patient is a 71 year old woman who on routine MRI of the  neck was noted to have a right-sided thyroid nodule. This was biopsied and  showed multiple microfollicular cells at which point thyroid cancer could  not be ruled out. The patient comes to the operating room now for right  thyroid lobectomy with frozen section. She understands the risks and  potential benefits of surgery, specifically the risks of recurrent laryngeal  nerve injury and the possibility of parathyroid gland injury.  She accepts  these risks and comes to the operating room today.   DESCRIPTION OF PROCEDURE:  Following the induction of satisfactory general  anesthesia, the patient is positioned supinely. The neck is hyperextended by  placing a roll between her shoulders. The anterior neck and chest were  prepped and draped to be included in the sterile operative field. A  transverse collar incision is made approximately two fingerbreadths above  the sternal notch, deep and through the skin, subcutaneous tissue and  through the platysma muscle.  A superior myocutaneous flap  is raised up to  the thyroid cartilage and inferior flap carried down to the sternal notch.  The midline is opened up and the strap muscles separated carrying the  dissection down to the thyroid. The right thyroid lobe is exposed and  dissection carried down elevating the right thyroid lobe. I did not see a  specific middle thyroid vein. The dissection was carried up to the superior  pole of the thyroid where the superior pole vessels were isolated, sutured  suture ligated and clipped. All tissues along the thyroid were then  dissected down maintaining hemostasis throughout the course of dissection  with clips or with electrocautery. Both the superior and inferior  parathyroid glands were seen and identified and spared. The recurrent  laryngeal nerve was also noted and protected throughout the course of the  dissection. The inferior thyroid vessels were dissected free from the  thyroid gland and these were clipped with medium clips. The thyroid was then  reflected across the midline taking the pyramidal lobe and the isthmus. The  thyroid was transected at the junction of the isthmus and the left lobe. The  thyroid was then secured with suture ligatures of 3-0 silk suture. This  specimen was forwarded to pathology. The frozen section on this showed  follicular cells felt to be benign, however, final pathology is pending. All  areas of dissection were then checked for hemostasis. Additional bleeding  points treated with electrocautery or with clips. I placed a Surgicel pad  within the neck for additional hemostasis. Sponge and instrument counts were  verified. The midline of the neck was then closed with interrupted 3-0  Vicryl sutures. The platysma was closed with interrupted 3-0 Vicryl suture  and the skin was closed with running 4-0 Monocryl and then reinforced with  Steri-Strips. A sterile dressing applied. The anesthetic reversed and the  patient removed from the operating room to the  recovery room in stable  condition. She tolerated the procedure well.      PB/MEDQ  D:  01/18/2005  T:  01/18/2005  Job:  [id number]   cc:   Osvaldo Shipper. Spruill, M.D.  [address] [address]  [place]  [place] [address]  Fax: [phone]

## 2011-02-08 NOTE — Consult Note (Signed)
[name], DOUGLASS               ACCOUNT NO.:  [id number]   MEDICAL RECORD NO.:  [id number]                   PATIENT TYPE:  INP   LOCATION:  5727                                 FACILITY:  [facility]   PHYSICIAN:  Iva Boop, M.D. [facility]           DATE OF BIRTH:  1941/03/11   DATE OF CONSULTATION:  03/14/2004  DATE OF DISCHARGE:                                   CONSULTATION   REQUESTING PHYSICIAN:  Dr. Osvaldo Shipper. Spruill.   REASON FOR CONSULTATION:  Diverticulitis.   HISTORY:  This is a 70 year old African American woman that underwent  colonoscopy on March 06, 2004; it was a routine screening exam that I  performed.  She had sigmoid diverticulosis.  She did well for several days  and then about 3 days ago, developed acute left lower quadrant pain; it was  worse the next day, and then severe pain the following day.  She had some  fever and chills, loose bowel movements and she vomited. She was admitted.  A CT scan demonstrated thickening in the sigmoid colon, and some stranding  and diverticula compatible with sigmoid diverticulitis.  No abscess or  fluid.  She has been on ceftriaxone at this point and she is feeling  somewhat better.   PAST MEDICAL HISTORY:  1. As above.  2. Hypertension.  3. Abdominal hysterectomy.  4. Lumbar spine surgery with degenerative disk disease.  5. Bunionectomy.   DRUG ALLERGIES:  ASPIRIN, PHENERGAN, CODEINE, PENICILLIN, and CIPRO;  PENICILLIN and CIPRO have made her throat swell and caused breathing  difficulty.   MEDICATIONS:  1. Morphine p.r.n..  2. Hydrochlorothiazide.  3. Avapro.  4. Rocephin 1 g q.24 h.   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   FAMILY HISTORY:  Coronary artery disease in her dad.  Mom died of pneumonia  at age 20; father has died as well.  Two healthy siblings.   REVIEW OF SYSTEMS:  Sweats, fevers, chills, low back  pain, occasional hot  flash.  Weight stable.  Appetite had been good; it has been of a little bit.  No syncope or weakness.  All other systems are negative at this time.   PHYSICAL EXAMINATION:  GENERAL:  Physical exam reveals a pleasant, well-  developed, overweight black woman in no acute distress.  VITAL SIGNS:  Temperature 97.6, blood pressure 140/73, pulse 74,  respirations 18.  HEENT:  Her eyes are anicteric.  Mouth free of lesions.  NECK:  Supple.  CHEST:  Clear.  HEART:  S1 and S2.  No murmurs, rubs, or gallops.  ABDOMEN:  Abdomen is soft.  Bowel sounds are present.  She has some mild to  moderate left lower quadrant tenderness without much guarding.  There is no  mass.  EXTREMITIES:  Extremities show no peripheral edema.  She is alert and  oriented x3.   LABORATORY DATA:  Hemoglobin 10.4, MCV  82, hematocrit 31, white count 11.2.  BMET looks normal.  UA negative.   ASSESSMENT:  Sigmoid diverticulitis after colonoscopy it may be related to  her colonoscopy, I really cannot tell.  She seems to be improving at this  point on antibiotics.  I had originally intended to change her to Cipro and  Flagyl, but it turned out she has a Cipro allergy.  I think we will use the  ceftriaxone and Flagyl, given her penicillin allergy. We will follow her up  and I appreciate the opportunity to help care for this patient.   She should also be on a low-residue diet.                                               Iva Boop, M.D. [facility]    CEG/MEDQ  D:  03/14/2004  T:  03/16/2004  Job:  [id number]   cc:   Osvaldo Shipper. Spruill, M.D.  [address] [address]  [place]  [place] [address]  Fax: [phone]

## 2011-06-19 LAB — BASIC METABOLIC PANEL
BUN: 15
CO2: 25
Chloride: 104
Creatinine, Ser: 0.83

## 2011-08-01 ENCOUNTER — Other Ambulatory Visit (HOSPITAL_COMMUNITY): Payer: Self-pay | Admitting: Specialist

## 2011-08-01 DIAGNOSIS — Z1231 Encounter for screening mammogram for malignant neoplasm of breast: Secondary | ICD-10-CM

## 2011-09-02 ENCOUNTER — Ambulatory Visit (HOSPITAL_COMMUNITY)
Admission: RE | Admit: 2011-09-02 | Discharge: 2011-09-02 | Disposition: A | Discharge status: Home / Self Care | Payer: MEDICARE | Source: Ambulatory Visit | Attending: Specialist | Admitting: Specialist

## 2011-09-02 DIAGNOSIS — Z1231 Encounter for screening mammogram for malignant neoplasm of breast: Secondary | ICD-10-CM | POA: Insufficient documentation

## 2012-06-08 ENCOUNTER — Encounter (HOSPITAL_BASED_OUTPATIENT_CLINIC_OR_DEPARTMENT_OTHER): Payer: Self-pay | Admitting: *Deleted

## 2012-06-08 NOTE — Progress Notes (Signed)
To come in for bmet-will ck with dr spruill for ekg Still works part time [employer]

## 2012-06-10 ENCOUNTER — Encounter (HOSPITAL_BASED_OUTPATIENT_CLINIC_OR_DEPARTMENT_OTHER)
Admission: RE | Admit: 2012-06-10 | Discharge: 2012-06-10 | Disposition: A | Discharge status: Home / Self Care | Payer: MEDICARE | Source: Ambulatory Visit | Attending: Orthopedic Surgery | Admitting: Orthopedic Surgery

## 2012-06-10 ENCOUNTER — Other Ambulatory Visit: Payer: Self-pay

## 2012-06-10 LAB — BASIC METABOLIC PANEL
BUN: 16 mg/dL (ref 6–23)
CO2: 24 mEq/L (ref 19–32)
Calcium: 10.2 mg/dL (ref 8.4–10.5)
GFR calc non Af Amer: 72 mL/min — ABNORMAL LOW (ref >90)
Glucose, Bld: 135 mg/dL — ABNORMAL HIGH (ref 70–99)

## 2012-06-11 NOTE — Progress Notes (Signed)
Unable to fit patient's entire last name into EKG machine.  Used second part

## 2012-06-12 ENCOUNTER — Ambulatory Visit (HOSPITAL_BASED_OUTPATIENT_CLINIC_OR_DEPARTMENT_OTHER): Payer: MEDICARE | Admitting: Anesthesiology

## 2012-06-12 ENCOUNTER — Encounter (HOSPITAL_BASED_OUTPATIENT_CLINIC_OR_DEPARTMENT_OTHER): Payer: Self-pay | Admitting: Anesthesiology

## 2012-06-12 ENCOUNTER — Encounter (HOSPITAL_BASED_OUTPATIENT_CLINIC_OR_DEPARTMENT_OTHER)
Admission: RE | Disposition: A | Discharge status: Home / Self Care | Payer: Self-pay | Source: Ambulatory Visit | Attending: Orthopedic Surgery

## 2012-06-12 ENCOUNTER — Ambulatory Visit (HOSPITAL_BASED_OUTPATIENT_CLINIC_OR_DEPARTMENT_OTHER)
Admission: RE | Admit: 2012-06-12 | Discharge: 2012-06-12 | Disposition: A | Discharge status: Home / Self Care | Payer: MEDICARE | Source: Ambulatory Visit | Attending: Orthopedic Surgery | Admitting: Orthopedic Surgery

## 2012-06-12 ENCOUNTER — Encounter (HOSPITAL_BASED_OUTPATIENT_CLINIC_OR_DEPARTMENT_OTHER): Payer: Self-pay

## 2012-06-12 DIAGNOSIS — G5600 Carpal tunnel syndrome, unspecified upper limb: Secondary | ICD-10-CM | POA: Insufficient documentation

## 2012-06-12 DIAGNOSIS — Z01812 Encounter for preprocedural laboratory examination: Secondary | ICD-10-CM | POA: Insufficient documentation

## 2012-06-12 DIAGNOSIS — Z01810 Encounter for preprocedural cardiovascular examination: Secondary | ICD-10-CM | POA: Insufficient documentation

## 2012-06-12 DIAGNOSIS — E785 Hyperlipidemia, unspecified: Secondary | ICD-10-CM | POA: Insufficient documentation

## 2012-06-12 DIAGNOSIS — I10 Essential (primary) hypertension: Secondary | ICD-10-CM | POA: Insufficient documentation

## 2012-06-12 HISTORY — DX: Hyperlipidemia, unspecified: E78.5

## 2012-06-12 HISTORY — DX: Unspecified osteoarthritis, unspecified site: M19.90

## 2012-06-12 HISTORY — DX: Essential (primary) hypertension: I10

## 2012-06-12 HISTORY — PX: CARPAL TUNNEL RELEASE: SHX101

## 2012-06-12 LAB — POCT HEMOGLOBIN-HEMACUE: Hemoglobin: 15 g/dL (ref 12.0–15.0)

## 2012-06-12 SURGERY — CARPAL TUNNEL RELEASE
Anesthesia: General | Site: Wrist | Laterality: Right | Wound class: Clean

## 2012-06-12 MED ORDER — FENTANYL CITRATE 0.05 MG/ML IJ SOLN
INTRAMUSCULAR | Status: DC | PRN
  Administered 2012-06-12 (×2): 50 ug via INTRAVENOUS

## 2012-06-12 MED ORDER — PROPOFOL 10 MG/ML IV BOLUS
INTRAVENOUS | Status: DC | PRN
  Administered 2012-06-12: 180 mg via INTRAVENOUS

## 2012-06-12 MED ORDER — OXYCODONE HCL 5 MG/5ML PO SOLN: 5.0000 mg | Freq: Once | ORAL | Status: DC | PRN

## 2012-06-12 MED ORDER — LACTATED RINGERS IV SOLN
INTRAVENOUS | Status: DC
  Administered 2012-06-12: 13:00:00 via INTRAVENOUS

## 2012-06-12 MED ORDER — PENTAZOCINE-NALOXONE 50-0.5 MG PO TABS: 1.0000 | ORAL_TABLET | ORAL | Status: DC | PRN

## 2012-06-12 MED ORDER — CHLORHEXIDINE GLUCONATE 4 % EX LIQD: 60.0000 mL | Freq: Once | CUTANEOUS | Status: DC

## 2012-06-12 MED ORDER — BUPIVACAINE HCL (PF) 0.25 % IJ SOLN
INTRAMUSCULAR | Status: DC | PRN
  Administered 2012-06-12: 8 mL

## 2012-06-12 MED ORDER — VANCOMYCIN HCL IN DEXTROSE 1-5 GM/200ML-% IV SOLN
1000.0000 mg | INTRAVENOUS | Status: AC
  Administered 2012-06-12: 1000 mg via INTRAVENOUS

## 2012-06-12 MED ORDER — MIDAZOLAM HCL 5 MG/5ML IJ SOLN
INTRAMUSCULAR | Status: DC | PRN
  Administered 2012-06-12 (×2): 1 mg via INTRAVENOUS

## 2012-06-12 MED ORDER — DEXAMETHASONE SODIUM PHOSPHATE 10 MG/ML IJ SOLN
INTRAMUSCULAR | Status: DC | PRN
  Administered 2012-06-12: 5 mg via INTRAVENOUS

## 2012-06-12 MED ORDER — OXYCODONE HCL 5 MG PO TABS: 5.0000 mg | ORAL_TABLET | Freq: Once | ORAL | Status: DC | PRN

## 2012-06-12 MED ORDER — FENTANYL CITRATE 0.05 MG/ML IJ SOLN: 25.0000 ug | INTRAMUSCULAR | Status: DC | PRN

## 2012-06-12 MED ORDER — MEPERIDINE HCL 25 MG/ML IJ SOLN: 6.2500 mg | INTRAMUSCULAR | Status: DC | PRN

## 2012-06-12 MED ORDER — PROMETHAZINE HCL 25 MG/ML IJ SOLN: 6.2500 mg | INTRAMUSCULAR | Status: DC | PRN

## 2012-06-12 MED ORDER — LIDOCAINE HCL (CARDIAC) 20 MG/ML IV SOLN
INTRAVENOUS | Status: DC | PRN
  Administered 2012-06-12: 80 mg via INTRAVENOUS

## 2012-06-12 SURGICAL SUPPLY — 34 items
BANDAGE GAUZE ELAST BULKY 4 IN (GAUZE/BANDAGES/DRESSINGS) ×2 IMPLANT
BLADE SURG 15 STRL LF DISP TIS (BLADE) ×1 IMPLANT
BLADE SURG 15 STRL SS (BLADE) ×1
BNDG COHESIVE 3X5 TAN STRL LF (GAUZE/BANDAGES/DRESSINGS) ×2 IMPLANT
BNDG ESMARK 4X9 LF (GAUZE/BANDAGES/DRESSINGS) ×2 IMPLANT
CHLORAPREP W/TINT 26ML (MISCELLANEOUS) ×2 IMPLANT
CLOTH BEACON ORANGE TIMEOUT ST (SAFETY) ×2 IMPLANT
CORDS BIPOLAR (ELECTRODE) ×2 IMPLANT
COVER MAYO STAND STRL (DRAPES) ×2 IMPLANT
COVER TABLE BACK 60X90 (DRAPES) ×2 IMPLANT
CUFF TOURNIQUET SINGLE 18IN (TOURNIQUET CUFF) ×2 IMPLANT
DRAPE EXTREMITY T 121X128X90 (DRAPE) ×2 IMPLANT
DRAPE SURG 17X23 STRL (DRAPES) ×2 IMPLANT
DRSG KUZMA FLUFF (GAUZE/BANDAGES/DRESSINGS) ×2 IMPLANT
GAUZE XEROFORM 1X8 LF (GAUZE/BANDAGES/DRESSINGS) ×2 IMPLANT
GLOVE BIO SURGEON STRL SZ 6.5 (GLOVE) ×2 IMPLANT
GLOVE SURG ORTHO 8.0 STRL STRW (GLOVE) ×2 IMPLANT
GOWN BRE IMP PREV XXLGXLNG (GOWN DISPOSABLE) ×2 IMPLANT
GOWN PREVENTION PLUS XLARGE (GOWN DISPOSABLE) ×2 IMPLANT
NEEDLE 27GAX1X1/2 (NEEDLE) ×2 IMPLANT
NS IRRIG 1000ML POUR BTL (IV SOLUTION) ×2 IMPLANT
PACK BASIN DAY SURGERY FS (CUSTOM PROCEDURE TRAY) ×2 IMPLANT
PAD CAST 3X4 CTTN HI CHSV (CAST SUPPLIES) ×1 IMPLANT
PADDING CAST ABS 4INX4YD NS (CAST SUPPLIES) ×1
PADDING CAST ABS COTTON 4X4 ST (CAST SUPPLIES) ×1 IMPLANT
PADDING CAST COTTON 3X4 STRL (CAST SUPPLIES) ×1
SPONGE GAUZE 4X4 12PLY (GAUZE/BANDAGES/DRESSINGS) ×2 IMPLANT
STOCKINETTE 4X48 STRL (DRAPES) ×2 IMPLANT
SUT VICRYL 4-0 PS2 18IN ABS (SUTURE) IMPLANT
SUT VICRYL RAPIDE 4/0 PS 2 (SUTURE) ×2 IMPLANT
SYR BULB 3OZ (MISCELLANEOUS) ×2 IMPLANT
SYR CONTROL 10ML LL (SYRINGE) ×2 IMPLANT
TOWEL OR 17X24 6PK STRL BLUE (TOWEL DISPOSABLE) ×2 IMPLANT
UNDERPAD 30X30 INCONTINENT (UNDERPADS AND DIAPERS) ×2 IMPLANT

## 2012-06-12 NOTE — Anesthesia Preprocedure Evaluation (Signed)
Anesthesia Evaluation  Patient identified by MRN, date of birth, ID band Patient awake    Reviewed: Allergy & Precautions, H&P , NPO status , Patient's Chart, lab work & pertinent test results  History of Anesthesia Complications Negative for: history of anesthetic complications  Airway Mallampati: I  Neck ROM: Full    Dental  (+) Teeth Intact   Pulmonary neg pulmonary ROS,  breath sounds clear to auscultation        Cardiovascular hypertension, Rhythm:Regular Rate:Normal + Systolic murmurs    Neuro/Psych negative neurological ROS     GI/Hepatic negative GI ROS, Neg liver ROS,   Endo/Other  negative endocrine ROS  Renal/GU negative Renal ROS     Musculoskeletal   Abdominal   Peds  Hematology negative hematology ROS (+)   Anesthesia Other Findings   Reproductive/Obstetrics                           Anesthesia Physical Anesthesia Plan  ASA: II  Anesthesia Plan: General   Post-op Pain Management:    Induction: Intravenous  Airway Management Planned: LMA  Additional Equipment:   Intra-op Plan:   Post-operative Plan: Extubation in OR  Informed Consent: I have reviewed the patients History and Physical, chart, labs and discussed the procedure including the risks, benefits and alternatives for the proposed anesthesia with the patient or authorized representative who has indicated his/her understanding and acceptance.   Dental advisory given  Plan Discussed with: CRNA and Surgeon  Anesthesia Plan Comments:         Anesthesia Quick Evaluation

## 2012-06-12 NOTE — H&P (Signed)
[name] is a 71 year old right hand dominant female referred by Dr. Donia Guiles for a consultation with respect to pain, numbness and tingling in her right hand. This has been going on for the past 3-4 weeks. She has no history of injury to the hand or neck. She is awakened 7 out of 7 nights. She has been taking Tylenol with relief. No history of diabetes, thyroid problems, arthritis or gout. There is a family history of diabetes and she has been tested. She complains of intermittent, extremely severe numbness and tingling in the thumb, index and middle finger. She is not complaining of symptoms on her left side. 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  Past Medical History: She is allergic to aspirin, Codeine, PCN, NSAIDS, and Darvocet. She is on Zetia and a BP medication. She has had bilateral hip replacements and back surgery.  Family Medical History: Positive for high BP and arthritis.  Social History: She does smoke 5 cigarettes a day and is advised to quit. She does not drink. She is married and a Neurosurgeon.  Review of Systems: Positive for glasses, otherwise negative for 14 points. [name] is an 71 y.o. female.   Chief Complaint: CTS Rt HPI: see above  Past Medical History  Diagnosis Date  . Hypertension   . Hyperlipemia   . Osteoarthritis     Past Surgical History  Procedure Date  . Total hip arthroplasty 8/11    left  . Shoulder arthroscopy 08,09    right and left  . Back surgery     lumb 1999  . Abdominal hysterectomy   . Total hip arthroplasty 2010    left  . Colonoscopy   . Fracture surgery     rt foot  . Foot arthrodesis, modified [facility]br[facility] 2002    rt     History reviewed. No pertinent family history. Social History:  reports that she has never smoked. She does not have any smokeless tobacco history on file. She reports that she does not drink alcohol or use illicit drugs.  Allergies:  Allergies  Allergen Reactions    . Ciprofloxacin Shortness Of Breath  . Codeine Shortness Of Breath  . Hydrocodone Shortness Of Breath  . Morphine And Related Shortness Of Breath  . Oxycodone Shortness Of Breath  . Penicillins Shortness Of Breath  . Asa (Aspirin)     Gi upset  . Nsaids     Gi upset  . Phenergan (Promethazine Hcl) Nausea And Vomiting    headache    Medications Prior to Admission  Medication Sig Dispense Refill  . acetaminophen (TYLENOL) 500 MG tablet Take 500 mg by mouth every 6 (six) hours as needed.      Marland Kitchen amLODipine-benazepril (LOTREL) 10-20 MG per capsule Take 1 capsule by mouth daily.      Marland Kitchen aspirin 81 MG tablet Take 81 mg by mouth daily.      Marland Kitchen ezetimibe (ZETIA) 10 MG tablet Take 10 mg by mouth daily.      . fenofibrate micronized (LOFIBRA) 134 MG capsule Take 134 mg by mouth daily before breakfast.        No results found for this or any previous visit (from the past 48 hour(s)).  No results found.   Pertinent items are noted in HPI.  Blood pressure 143/77, pulse 71, temperature 98.4 F (36.9 C), temperature source Oral, resp. rate 20, height 5' 10.5" (1.791 m), weight 91.627 kg (202 lb), SpO2 98.00%.  General  appearance: alert, cooperative and appears stated age Head: Normocephalic, without obvious abnormality Neck: no adenopathy Resp: clear to auscultation bilaterally Cardio: regular rate and rhythm, S1, S2 normal, no murmur, click, rub or gallop GI: soft, non-tender; bowel sounds normal; no masses,  no organomegaly Extremities: extremities normal, atraumatic, no cyanosis or edema Pulses: 2+ and symmetric Skin: Skin color, texture, turgor normal. No rashes or lesions Neurologic: Grossly normal Incision/Wound: na  Assessment/Plan DX: right carpal tunnel syndrome.  She would like to proceed to have this done. Questions were invited and answered in detail. She is scheduled for right carpal tunnel release as an outpatient .  Milly Goggins R 06/12/2012, 12:31 PM

## 2012-06-12 NOTE — Transfer of Care (Signed)
Immediate Anesthesia Transfer of Care Note  Patient: [name]  Procedure(s) Performed: Procedure(s) (LRB) with comments: CARPAL TUNNEL RELEASE (Right)  Patient Location: PACU  Anesthesia Type: General  Level of Consciousness: sedated  Airway & Oxygen Therapy: Patient Spontanous Breathing and Patient connected to face mask oxygen  Post-op Assessment: Report given to PACU RN and Post -op Vital signs reviewed and stable  Post vital signs: Reviewed and stable  Complications: No apparent anesthesia complications

## 2012-06-12 NOTE — Anesthesia Procedure Notes (Signed)
Procedure Name: LMA Insertion Date/Time: 06/12/2012 1:46 PM Performed by: Burna Cash Pre-anesthesia Checklist: Patient identified, Emergency Drugs available, Suction available and Patient being monitored Patient Re-evaluated:Patient Re-evaluated prior to inductionOxygen Delivery Method: Circle System Utilized Preoxygenation: Pre-oxygenation with 100% oxygen Intubation Type: IV induction Ventilation: Mask ventilation without difficulty LMA: LMA inserted LMA Size: 4.0 Number of attempts: 1 Airway Equipment and Method: bite block Placement Confirmation: positive ETCO2 Tube secured with: Tape Dental Injury: Teeth and Oropharynx as per pre-operative assessment

## 2012-06-12 NOTE — Brief Op Note (Signed)
06/12/2012  2:12 PM  PATIENT:  Burnetta Rouse-Degraffenriedt  71 y.o. female  PRE-OPERATIVE DIAGNOSIS:  right cts  POST-OPERATIVE DIAGNOSIS:  right cts  PROCEDURE:  Procedure(s) (LRB) with comments: CARPAL TUNNEL RELEASE (Right)  SURGEON:  Surgeon(s) and Role:    * Nicki Reaper, MD - Primary  PHYSICIAN ASSISTANT:   ASSISTANTS: none   ANESTHESIA:   local and general  EBL:  Total I/O In: 100 [I.V.:100] Out: -   BLOOD ADMINISTERED:none  DRAINS: none   LOCAL MEDICATIONS USED:  MARCAINE     SPECIMEN:  No Specimen  DISPOSITION OF SPECIMEN:  N/A  COUNTS:  YES  TOURNIQUET:  * Missing tourniquet times found for documented tourniquets in log:  [id number] *  DICTATION: .Other Dictation: Dictation Number [id number]  PLAN OF CARE: Discharge to home after PACU  PATIENT DISPOSITION:  PACU - hemodynamically stable.

## 2012-06-12 NOTE — Op Note (Signed)
Dictated [phone]

## 2012-06-12 NOTE — Anesthesia Postprocedure Evaluation (Signed)
  Anesthesia Post-op Note  Patient: [name]  Procedure(s) Performed: Procedure(s) (LRB) with comments: CARPAL TUNNEL RELEASE (Right)  Patient Location: PACU  Anesthesia Type: General  Level of Consciousness: awake  Airway and Oxygen Therapy: Patient Spontanous Breathing  Post-op Pain: mild  Post-op Assessment: Post-op Vital signs reviewed  Post-op Vital Signs: stable  Complications: No apparent anesthesia complications

## 2012-06-13 NOTE — Op Note (Signed)
[name], [name]   ACCOUNT NO.:  [id number]  MEDICAL RECORD NO.:  [id number]  LOCATION:                                 FACILITY:  PHYSICIAN:  Cindee Salt, M.D.       DATE OF BIRTH:  1941/08/29  DATE OF PROCEDURE:  06/12/2012 DATE OF DISCHARGE:                              OPERATIVE REPORT   PREOPERATIVE DIAGNOSIS:  Carpal tunnel syndrome, right hand.  POSTOPERATIVE DIAGNOSIS:  Carpal tunnel syndrome, right hand.  OPERATION:  Decompression of right median nerve.  SURGEON:  Cindee Salt, MD  ANESTHESIA:  General with local infiltration.  ANESTHESIOLOGIST:  Burna Forts, MD.  HISTORY:  The patient is a 71 year old female with a history of carpal tunnel syndrome.  EMG nerve conduction is positive which has not responded to conservative treatment.  Pre, peri, and postoperative courses have been discussed along with risks and complications.  She is aware that there is no guarantee with surgery; possibility of infection; recurrence; injury to arteries, nerves, tendons; incomplete relief of symptoms; dystrophy.  In the preoperative area, the patient is seen, the extremity marked by both the patient and surgeon.  Antibiotic given.  PROCEDURE:  The patient was brought to the operating room, where a general anesthetic was carried out without difficulty.  She was prepped using ChloraPrep in supine position with right arm free.  A 3-minute dry time was allowed.  Time-out taken confirming the patient and procedure. A longitudinal incision was made in the palm, carried down through subcutaneous tissue.  Bleeders were electrocauterized.  Palmar fascia was split.  Superficial palmar arch identified.  The flexor tendon to the ring little finger identified, to the ulnar side of the median nerve.  The carpal retinaculum was incised with sharp dissection.  Right angle and Sewall retractor were placed between skin and forearm fascia. The fascia was released for approximately a  centimeter and half proximal to the wrist crease under direct vision.  Area of compression to the nerve was apparent with a very significant hyperemia in the entire course of the nerve.  The wound was irrigated.  The skin closed with interrupted 4-0 Vicryl Rapide sutures.  Local infiltration with 0.25% Marcaine without epinephrine was given, approximately 8 mL was used. Sterile compressive dressing with fingers free was applied.  On deflation of the tourniquet, all fingers immediately pinked.  She was taken to the recovery room for observation in satisfactory condition.  She will be discharged home to return to the [facility] of [place] in 1 week, on [address].          ______________________________ Cindee Salt, M.D.     GK/MEDQ  D:  06/12/2012  T:  06/13/2012  Job:  [id number]

## 2012-06-16 ENCOUNTER — Encounter (HOSPITAL_BASED_OUTPATIENT_CLINIC_OR_DEPARTMENT_OTHER): Payer: Self-pay | Admitting: Orthopedic Surgery

## 2012-08-31 ENCOUNTER — Other Ambulatory Visit (HOSPITAL_COMMUNITY): Payer: Self-pay | Admitting: Cardiology

## 2012-08-31 DIAGNOSIS — Z1231 Encounter for screening mammogram for malignant neoplasm of breast: Secondary | ICD-10-CM

## 2012-09-22 ENCOUNTER — Ambulatory Visit (HOSPITAL_COMMUNITY)
Admission: RE | Admit: 2012-09-22 | Discharge: 2012-09-22 | Disposition: A | Discharge status: Home / Self Care | Payer: MEDICARE | Source: Ambulatory Visit | Attending: Cardiology | Admitting: Cardiology

## 2012-09-22 DIAGNOSIS — Z1231 Encounter for screening mammogram for malignant neoplasm of breast: Secondary | ICD-10-CM | POA: Insufficient documentation

## 2013-02-03 ENCOUNTER — Encounter (HOSPITAL_COMMUNITY): Payer: Self-pay | Admitting: *Deleted

## 2013-02-03 ENCOUNTER — Emergency Department (HOSPITAL_COMMUNITY)
Admission: EM | Admit: 2013-02-03 | Discharge: 2013-02-03 | Disposition: A | Discharge status: Home / Self Care | Payer: MEDICARE | Attending: Emergency Medicine | Admitting: Emergency Medicine

## 2013-02-03 DIAGNOSIS — Z79899 Other long term (current) drug therapy: Secondary | ICD-10-CM | POA: Insufficient documentation

## 2013-02-03 DIAGNOSIS — R55 Syncope and collapse: Secondary | ICD-10-CM | POA: Insufficient documentation

## 2013-02-03 DIAGNOSIS — Z7982 Long term (current) use of aspirin: Secondary | ICD-10-CM | POA: Insufficient documentation

## 2013-02-03 DIAGNOSIS — R42 Dizziness and giddiness: Secondary | ICD-10-CM | POA: Insufficient documentation

## 2013-02-03 DIAGNOSIS — I10 Essential (primary) hypertension: Secondary | ICD-10-CM | POA: Insufficient documentation

## 2013-02-03 DIAGNOSIS — Z8739 Personal history of other diseases of the musculoskeletal system and connective tissue: Secondary | ICD-10-CM | POA: Insufficient documentation

## 2013-02-03 DIAGNOSIS — E785 Hyperlipidemia, unspecified: Secondary | ICD-10-CM | POA: Insufficient documentation

## 2013-02-03 LAB — CBC WITH DIFFERENTIAL/PLATELET
Basophils Absolute: 0 10*3/uL (ref 0.0–0.1)
Eosinophils Absolute: 0.3 10*3/uL (ref 0.0–0.7)
Eosinophils Relative: 5 % (ref 0–5)
HCT: 41.7 % (ref 36.0–46.0)
Lymphocytes Relative: 45 % (ref 12–46)
MCH: 30.7 pg (ref 26.0–34.0)
MCHC: 35 g/dL (ref 30.0–36.0)
MCV: 87.8 fL (ref 78.0–100.0)
Monocytes Absolute: 0.4 10*3/uL (ref 0.1–1.0)
RDW: 13.8 % (ref 11.5–15.5)

## 2013-02-03 LAB — POCT I-STAT TROPONIN I: Troponin i, poc: 0 ng/mL (ref 0.00–0.08)

## 2013-02-03 LAB — POCT I-STAT, CHEM 8
BUN: 25 mg/dL — ABNORMAL HIGH (ref 6–23)
Calcium, Ion: 1.21 mmol/L (ref 1.13–1.30)
TCO2: 24 mmol/L (ref 0–100)

## 2013-02-03 MED ORDER — SODIUM CHLORIDE 0.9 % IV BOLUS (SEPSIS): 1000.0000 mL | Freq: Once | INTRAVENOUS | Status: DC

## 2013-02-03 NOTE — ED Provider Notes (Signed)
History     CSN: [id number]  Arrival date & time 02/03/13  4098   First MD Initiated Contact with Patient 02/03/13 1904      Chief Complaint  Patient presents with  . Loss of Consciousness    (Consider location/radiation/quality/duration/timing/severity/associated sxs/prior treatment) The history is provided by the patient.  [name] is a 72 y.o. female history hypertension, hyperlipidemia here with syncope. She was in the hospital visiting her nephew today. Her nephew passed away today in the hospital. She was on the bus stop was sitting and felt lightheaded and dizzy. She then passed out but her family was there to catch her so there is no head injury. Denies any chest pain or shortness of breath. Denies any thoughts of suicide. She also didn't eat dinner and last meal was around 2 pm.    Past Medical History  Diagnosis Date  . Hypertension   . Hyperlipemia   . Osteoarthritis     Past Surgical History  Procedure Laterality Date  . Total hip arthroplasty  8/11    left  . Shoulder arthroscopy  08,09    right and left  . Back surgery      lumb 1999  . Abdominal hysterectomy    . Total hip arthroplasty  2010    left  . Colonoscopy    . Fracture surgery      rt foot  . Foot arthrodesis, modified mcbride  2002    rt   . Carpal tunnel release  06/12/2012    Procedure: CARPAL TUNNEL RELEASE;  Surgeon: Nicki Reaper, MD;  Location: [facility];  Service: Orthopedics;  Laterality: Right;    History reviewed. No pertinent family history.  History  Substance Use Topics  . Smoking status: Never Smoker   . Smokeless tobacco: Not on file  . Alcohol Use: No    OB History   Grav Para Term Preterm Abortions TAB SAB Ect Mult Living                  Review of Systems  Cardiovascular: Positive for syncope.  Neurological: Positive for dizziness and syncope.  All other systems reviewed and are negative.    Allergies  Ciprofloxacin; Codeine;  Hydrocodone; Morphine and related; Oxycodone; Penicillins; Asa; Nsaids; and Phenergan  Home Medications   Current Outpatient Rx  Name  Route  Sig  Dispense  Refill  . amLODipine-benazepril (LOTREL) 10-20 MG per capsule   Oral   Take 1 capsule by mouth daily.         Marland Kitchen aspirin 81 MG tablet   Oral   Take 81 mg by mouth daily.         Marland Kitchen ezetimibe (ZETIA) 10 MG tablet   Oral   Take 10 mg by mouth daily.         . fenofibrate micronized (LOFIBRA) 134 MG capsule   Oral   Take 134 mg by mouth daily before breakfast.           BP 123/57  Pulse 73  Resp 20  SpO2 100%  Physical Exam  Nursing note and vitals reviewed. Constitutional: She is oriented to person, place, and time. She appears well-developed and well-nourished.  Tearful, tired   HENT:  Head: Normocephalic.  MM slightly dry   Eyes: Conjunctivae are normal. Pupils are equal, round, and reactive to light.  Neck: Normal range of motion. Neck supple.  Cardiovascular: Normal rate, regular rhythm and normal heart sounds.   Pulmonary/Chest:  Effort normal and breath sounds normal. No respiratory distress. She has no wheezes. She has no rales.  Abdominal: Soft. Bowel sounds are normal. She exhibits no distension. There is no tenderness. There is no rebound and no guarding.  Musculoskeletal: Normal range of motion. She exhibits no edema and no tenderness.  Neurological: She is alert and oriented to person, place, and time.  Skin: Skin is warm and dry.  Psychiatric:  Depressed, not suicidal     ED Course  Procedures (including critical care time)  Labs Reviewed  CBC WITH DIFFERENTIAL - Abnormal; Notable for the following:    Neutrophils Relative % 42 (*)    All other components within normal limits  POCT I-STAT, CHEM 8 - Abnormal; Notable for the following:    BUN 25 (*)    Creatinine, Ser 1.20 (*)    Glucose, Bld 133 (*)    Hemoglobin 15.3 (*)    All other components within normal limits   No results  found.   1. Syncope      Date: 02/03/2013  Rate: 73  Rhythm: normal sinus rhythm  QRS Axis: normal  Intervals: normal  ST/T Wave abnormalities: normal  Conduction Disutrbances:none  Narrative Interpretation:   Old EKG Reviewed: none available    MDM  [name] is a 72 y.o. female here with syncope. Likely from dehydration and from stress. No hx of CAD or heart failure to cause her syncope. Will check orthostatics and basic labs. Patient doesn't want IVF so will give PO fluids.   7:45 PM Patient felt better after food. Labs unremarkable. D/c home.        Richardean Canal, MD 02/03/13 [phone]

## 2013-02-03 NOTE — ED Notes (Addendum)
Pt was in parking lot with family, started stating to family that she was feeling dizzy, pt then had syncopal episode, staff assisted her to wheelchair from parking lot, pt alert upon our arrival to her, pale and diaphoretic, denies pain, c/o nausea and dizziness at this time

## 2014-02-17 ENCOUNTER — Encounter: Payer: Self-pay | Admitting: Internal Medicine

## 2014-03-01 ENCOUNTER — Telehealth: Payer: Self-pay | Admitting: Internal Medicine

## 2014-03-02 NOTE — Telephone Encounter (Signed)
Left message for patient to call back  

## 2014-03-02 NOTE — Telephone Encounter (Signed)
I spoke with the patient and advised that her last colonoscopy was 03/06/2004 and she is due for a colonoscopy.  She wants to call back to schedule once she has her work schedule for August.

## 2014-05-02 DIAGNOSIS — R0789 Other chest pain: Secondary | ICD-10-CM | POA: Diagnosis not present

## 2014-07-13 DIAGNOSIS — R0789 Other chest pain: Secondary | ICD-10-CM | POA: Diagnosis not present

## 2014-07-14 ENCOUNTER — Ambulatory Visit
Admission: RE | Admit: 2014-07-14 | Discharge: 2014-07-14 | Disposition: A | Discharge status: Home / Self Care | Payer: Medicare Other | Source: Ambulatory Visit | Attending: Cardiology | Admitting: Cardiology

## 2014-07-14 ENCOUNTER — Other Ambulatory Visit: Payer: Self-pay | Admitting: Cardiology

## 2014-07-14 DIAGNOSIS — J40 Bronchitis, not specified as acute or chronic: Secondary | ICD-10-CM

## 2014-09-30 ENCOUNTER — Encounter: Payer: Self-pay | Admitting: Internal Medicine

## 2014-12-27 DIAGNOSIS — H25013 Cortical age-related cataract, bilateral: Secondary | ICD-10-CM | POA: Diagnosis not present

## 2014-12-27 DIAGNOSIS — H2513 Age-related nuclear cataract, bilateral: Secondary | ICD-10-CM | POA: Diagnosis not present

## 2014-12-27 DIAGNOSIS — H40003 Preglaucoma, unspecified, bilateral: Secondary | ICD-10-CM | POA: Diagnosis not present

## 2014-12-27 DIAGNOSIS — H524 Presbyopia: Secondary | ICD-10-CM | POA: Diagnosis not present

## 2014-12-27 DIAGNOSIS — H5201 Hypermetropia, right eye: Secondary | ICD-10-CM | POA: Diagnosis not present

## 2014-12-27 DIAGNOSIS — H52203 Unspecified astigmatism, bilateral: Secondary | ICD-10-CM | POA: Diagnosis not present

## 2014-12-27 DIAGNOSIS — H5202 Hypermetropia, left eye: Secondary | ICD-10-CM | POA: Diagnosis not present

## 2015-01-13 ENCOUNTER — Encounter: Payer: Self-pay | Admitting: Internal Medicine

## 2015-01-18 DIAGNOSIS — H5202 Hypermetropia, left eye: Secondary | ICD-10-CM | POA: Diagnosis not present

## 2015-01-18 DIAGNOSIS — H5201 Hypermetropia, right eye: Secondary | ICD-10-CM | POA: Diagnosis not present

## 2015-01-18 DIAGNOSIS — H40003 Preglaucoma, unspecified, bilateral: Secondary | ICD-10-CM | POA: Diagnosis not present

## 2015-01-18 DIAGNOSIS — H2513 Age-related nuclear cataract, bilateral: Secondary | ICD-10-CM | POA: Diagnosis not present

## 2015-01-18 DIAGNOSIS — H524 Presbyopia: Secondary | ICD-10-CM | POA: Diagnosis not present

## 2015-01-18 DIAGNOSIS — H52203 Unspecified astigmatism, bilateral: Secondary | ICD-10-CM | POA: Diagnosis not present

## 2015-01-18 DIAGNOSIS — H25013 Cortical age-related cataract, bilateral: Secondary | ICD-10-CM | POA: Diagnosis not present

## 2015-01-25 DIAGNOSIS — H40003 Preglaucoma, unspecified, bilateral: Secondary | ICD-10-CM | POA: Diagnosis not present

## 2015-02-21 DIAGNOSIS — M7582 Other shoulder lesions, left shoulder: Secondary | ICD-10-CM | POA: Diagnosis not present

## 2015-08-30 DIAGNOSIS — M25551 Pain in right hip: Secondary | ICD-10-CM | POA: Diagnosis not present

## 2016-02-06 DIAGNOSIS — M549 Dorsalgia, unspecified: Secondary | ICD-10-CM | POA: Diagnosis not present

## 2016-02-12 DIAGNOSIS — M25551 Pain in right hip: Secondary | ICD-10-CM | POA: Diagnosis not present

## 2016-02-14 DIAGNOSIS — M25551 Pain in right hip: Secondary | ICD-10-CM | POA: Diagnosis not present

## 2016-02-27 DIAGNOSIS — M545 Low back pain: Secondary | ICD-10-CM | POA: Diagnosis not present

## 2016-03-05 DIAGNOSIS — M545 Low back pain: Secondary | ICD-10-CM | POA: Diagnosis not present

## 2016-03-05 DIAGNOSIS — M5116 Intervertebral disc disorders with radiculopathy, lumbar region: Secondary | ICD-10-CM | POA: Diagnosis not present

## 2016-03-07 DIAGNOSIS — M5116 Intervertebral disc disorders with radiculopathy, lumbar region: Secondary | ICD-10-CM | POA: Diagnosis not present

## 2016-03-07 DIAGNOSIS — M545 Low back pain: Secondary | ICD-10-CM | POA: Diagnosis not present

## 2016-03-28 DIAGNOSIS — M5116 Intervertebral disc disorders with radiculopathy, lumbar region: Secondary | ICD-10-CM | POA: Diagnosis not present

## 2016-03-28 DIAGNOSIS — M545 Low back pain: Secondary | ICD-10-CM | POA: Diagnosis not present

## 2016-04-13 ENCOUNTER — Encounter (HOSPITAL_COMMUNITY): Payer: Self-pay | Admitting: Emergency Medicine

## 2016-04-13 ENCOUNTER — Observation Stay (HOSPITAL_COMMUNITY): Payer: Medicare Other

## 2016-04-13 ENCOUNTER — Emergency Department (HOSPITAL_COMMUNITY): Payer: Medicare Other

## 2016-04-13 ENCOUNTER — Observation Stay (HOSPITAL_COMMUNITY)
Admission: EM | Admit: 2016-04-13 | Discharge: 2016-04-14 | Disposition: A | Discharge status: Home / Self Care | Payer: Medicare Other | Attending: Internal Medicine | Admitting: Internal Medicine

## 2016-04-13 DIAGNOSIS — Z7982 Long term (current) use of aspirin: Secondary | ICD-10-CM | POA: Diagnosis not present

## 2016-04-13 DIAGNOSIS — M1990 Unspecified osteoarthritis, unspecified site: Secondary | ICD-10-CM | POA: Diagnosis not present

## 2016-04-13 DIAGNOSIS — E875 Hyperkalemia: Secondary | ICD-10-CM | POA: Diagnosis not present

## 2016-04-13 DIAGNOSIS — R072 Precordial pain: Secondary | ICD-10-CM

## 2016-04-13 DIAGNOSIS — Z8249 Family history of ischemic heart disease and other diseases of the circulatory system: Secondary | ICD-10-CM | POA: Insufficient documentation

## 2016-04-13 DIAGNOSIS — I959 Hypotension, unspecified: Secondary | ICD-10-CM | POA: Diagnosis not present

## 2016-04-13 DIAGNOSIS — Z96642 Presence of left artificial hip joint: Secondary | ICD-10-CM | POA: Insufficient documentation

## 2016-04-13 DIAGNOSIS — Z880 Allergy status to penicillin: Secondary | ICD-10-CM | POA: Insufficient documentation

## 2016-04-13 DIAGNOSIS — R202 Paresthesia of skin: Secondary | ICD-10-CM | POA: Insufficient documentation

## 2016-04-13 DIAGNOSIS — R0789 Other chest pain: Principal | ICD-10-CM | POA: Insufficient documentation

## 2016-04-13 DIAGNOSIS — E876 Hypokalemia: Secondary | ICD-10-CM | POA: Diagnosis not present

## 2016-04-13 DIAGNOSIS — I10 Essential (primary) hypertension: Secondary | ICD-10-CM

## 2016-04-13 DIAGNOSIS — F17210 Nicotine dependence, cigarettes, uncomplicated: Secondary | ICD-10-CM | POA: Insufficient documentation

## 2016-04-13 DIAGNOSIS — E785 Hyperlipidemia, unspecified: Secondary | ICD-10-CM

## 2016-04-13 DIAGNOSIS — R200 Anesthesia of skin: Secondary | ICD-10-CM | POA: Diagnosis not present

## 2016-04-13 DIAGNOSIS — R079 Chest pain, unspecified: Secondary | ICD-10-CM | POA: Diagnosis present

## 2016-04-13 LAB — COMPREHENSIVE METABOLIC PANEL
ALBUMIN: 3.4 g/dL — AB (ref 3.5–5.0)
ALT: 22 U/L (ref 14–54)
ANION GAP: 8 (ref 5–15)
AST: 26 U/L (ref 15–41)
Alkaline Phosphatase: 76 U/L (ref 38–126)
BUN: 9 mg/dL (ref 6–20)
CHLORIDE: 105 mmol/L (ref 101–111)
CO2: 27 mmol/L (ref 22–32)
Calcium: 9.3 mg/dL (ref 8.9–10.3)
Creatinine, Ser: 0.94 mg/dL (ref 0.44–1.00)
GFR calc Af Amer: >60 mL/min (ref >60)
GFR calc non Af Amer: 58 mL/min — ABNORMAL LOW (ref >60)
GLUCOSE: 117 mg/dL — AB (ref 65–99)
POTASSIUM: 2.9 mmol/L — AB (ref 3.5–5.1)
SODIUM: 140 mmol/L (ref 135–145)
Total Bilirubin: 0.5 mg/dL (ref 0.3–1.2)
Total Protein: 6.4 g/dL — ABNORMAL LOW (ref 6.5–8.1)

## 2016-04-13 LAB — I-STAT TROPONIN, ED: TROPONIN I, POC: 0 ng/mL (ref 0.00–0.08)

## 2016-04-13 LAB — CBC WITH DIFFERENTIAL/PLATELET
Basophils Absolute: 0 10*3/uL (ref 0.0–0.1)
Basophils Relative: 0 %
Eosinophils Absolute: 0.3 10*3/uL (ref 0.0–0.7)
Eosinophils Relative: 3 %
HEMATOCRIT: 42.6 % (ref 36.0–46.0)
HEMOGLOBIN: 14.5 g/dL (ref 12.0–15.0)
LYMPHS ABS: 0.6 10*3/uL — AB (ref 0.7–4.0)
Lymphocytes Relative: 6 %
MCH: 30.5 pg (ref 26.0–34.0)
MCHC: 34 g/dL (ref 30.0–36.0)
MCV: 89.5 fL (ref 78.0–100.0)
MONOS PCT: 5 %
Monocytes Absolute: 0.5 10*3/uL (ref 0.1–1.0)
NEUTROS ABS: 8.4 10*3/uL — AB (ref 1.7–7.7)
NEUTROS PCT: 86 %
Platelets: 310 10*3/uL (ref 150–400)
RBC: 4.76 MIL/uL (ref 3.87–5.11)
RDW: 14.4 % (ref 11.5–15.5)
WBC: 9.9 10*3/uL (ref 4.0–10.5)

## 2016-04-13 LAB — TROPONIN I: Troponin I: <0.03 ng/mL (ref <0.03)

## 2016-04-13 LAB — MAGNESIUM: Magnesium: 2.3 mg/dL (ref 1.7–2.4)

## 2016-04-13 LAB — LIPASE, BLOOD: Lipase: 76 U/L — ABNORMAL HIGH (ref 11–51)

## 2016-04-13 MED ORDER — ACETAMINOPHEN 325 MG PO TABS: 650.0000 mg | ORAL_TABLET | ORAL | Status: DC | PRN

## 2016-04-13 MED ORDER — BENAZEPRIL HCL 20 MG PO TABS
20.0000 mg | ORAL_TABLET | Freq: Every day | ORAL | Status: DC
  Filled 2016-04-13: qty 1

## 2016-04-13 MED ORDER — AMLODIPINE BESY-BENAZEPRIL HCL 10-20 MG PO CAPS: 1.0000 | ORAL_CAPSULE | Freq: Every day | ORAL | Status: DC

## 2016-04-13 MED ORDER — SODIUM CHLORIDE 0.9 % IV BOLUS (SEPSIS)
500.0000 mL | Freq: Once | INTRAVENOUS | Status: AC
  Administered 2016-04-13: 500 mL via INTRAVENOUS

## 2016-04-13 MED ORDER — ENOXAPARIN SODIUM 40 MG/0.4ML ~~LOC~~ SOLN
40.0000 mg | SUBCUTANEOUS | Status: DC
  Administered 2016-04-13: 40 mg via SUBCUTANEOUS
  Filled 2016-04-13: qty 0.4

## 2016-04-13 MED ORDER — ASPIRIN 325 MG PO TABS
325.0000 mg | ORAL_TABLET | Freq: Every day | ORAL | Status: DC
  Administered 2016-04-14: 325 mg via ORAL
  Filled 2016-04-13: qty 1

## 2016-04-13 MED ORDER — POTASSIUM CHLORIDE 10 MEQ/100ML IV SOLN
10.0000 meq | Freq: Once | INTRAVENOUS | Status: AC
  Administered 2016-04-13: 10 meq via INTRAVENOUS
  Filled 2016-04-13: qty 100

## 2016-04-13 MED ORDER — EZETIMIBE 10 MG PO TABS
10.0000 mg | ORAL_TABLET | Freq: Every day | ORAL | Status: DC
  Administered 2016-04-14: 10 mg via ORAL
  Filled 2016-04-13: qty 1

## 2016-04-13 MED ORDER — POTASSIUM CHLORIDE CRYS ER 20 MEQ PO TBCR
40.0000 meq | EXTENDED_RELEASE_TABLET | Freq: Once | ORAL | Status: AC
  Administered 2016-04-13: 40 meq via ORAL
  Filled 2016-04-13: qty 2

## 2016-04-13 MED ORDER — AMLODIPINE BESYLATE 10 MG PO TABS
10.0000 mg | ORAL_TABLET | Freq: Every day | ORAL | Status: DC
  Administered 2016-04-14: 10 mg via ORAL
  Filled 2016-04-13: qty 1

## 2016-04-13 MED ORDER — ONDANSETRON HCL 4 MG/2ML IJ SOLN: 4.0000 mg | Freq: Four times a day (QID) | INTRAMUSCULAR | Status: DC | PRN

## 2016-04-13 MED ORDER — FENOFIBRATE 54 MG PO TABS
54.0000 mg | ORAL_TABLET | Freq: Every day | ORAL | Status: DC
  Filled 2016-04-13: qty 1

## 2016-04-13 NOTE — ED Provider Notes (Signed)
CSN: [id number]     Arrival date & time 04/13/16  1408 History   First MD Initiated Contact with Patient 04/13/16 1412     Chief Complaint  Patient presents with  . Chest Pain  . Arm Pain     (Consider location/radiation/quality/duration/timing/severity/associated sxs/prior Treatment) The history is provided by the patient, the EMS personnel and medical records.     75 year old female with past medical history of hypertension, hyperlipidemia presents with acute onset of left upper chest pain and generalized weakness. The patient states that her symptoms started approximately 2 days ago with mild numbness and tingling of her left arm. The pain came and went throughout several days. She began to feel generally weak, so decided to present to urgent care earlier today. At urgent care, she was given aspirin and nitroglycerin. While at urgent care, after receiving the nitroglycerin, her blood pressure acutely dropped to 70 over palp. EMS was supple. He called and an IV was placed with improvement in her blood pressure with 250 mL of fluid. Currently, patient endorses sharp, left-sided, aching, throbbing chest pain that is made worse with certain positions. Denies any alleviating factors. She does continue to have intermittent left arm numbness or weakness. Denies any other weakness or numbness.  Past Medical History  Diagnosis Date  . Hypertension   . Hyperlipemia   . Osteoarthritis    Past Surgical History  Procedure Laterality Date  . Total hip arthroplasty  8/11    left  . Shoulder arthroscopy  08,09    right and left  . Back surgery      lumb 1999  . Abdominal hysterectomy    . Total hip arthroplasty  2010    left  . Colonoscopy    . Fracture surgery      rt foot  . Foot arthrodesis, modified [facility]cbride  2002    rt   . Carpal tunnel release  06/12/2012    Procedure: CARPAL TUNNEL RELEASE;  Surgeon: Wynonia Sours, MD;  Location: [place];  Service: Orthopedics;   Laterality: Right;   History reviewed. No pertinent family history. Social History  Substance Use Topics  . Smoking status: Never Smoker   . Smokeless tobacco: None  . Alcohol Use: No   OB History    No data available     Review of Systems  Constitutional: Positive for fatigue. Negative for fever and chills.  HENT: Negative for congestion and rhinorrhea.   Eyes: Negative for visual disturbance.  Respiratory: Positive for shortness of breath. Negative for cough and wheezing.   Cardiovascular: Positive for chest pain.  Gastrointestinal: Negative for nausea, vomiting, abdominal pain and diarrhea.  Genitourinary: Negative for dysuria and flank pain.  Musculoskeletal: Negative for neck pain.  Skin: Negative for rash.  Neurological: Negative for syncope, weakness and headaches.      Allergies  Ciprofloxacin; Codeine; Hydrocodone; Morphine and related; Oxycodone; Penicillins; Asa; Nsaids; Other; Phenergan; Gabapentin; and Tramadol  Home Medications   Prior to Admission medications   Medication Sig Start Date End Date Taking? Authorizing Provider  amLODipine-benazepril (LOTREL) 10-20 MG per capsule Take 1 capsule by mouth daily.   Yes Historical Provider, MD  aspirin 81 MG tablet Take 81 mg by mouth every morning.    Yes Historical Provider, MD  ezetimibe (ZETIA) 10 MG tablet Take 10 mg by mouth daily.   Yes Historical Provider, MD  fenofibrate micronized (LOFIBRA) 134 MG capsule Take 134 mg by mouth daily before breakfast.   Yes  Historical Provider, MD  hydrocortisone cream 1 % Apply 1 application topically 3 (three) times daily as needed for itching.   Yes Historical Provider, MD   BP 133/54 mmHg  Pulse 86  Temp(Src) 98.3 F (36.8 C) (Oral)  Resp 19  Ht 5\' 10"  (1.778 m)  SpO2 97% Physical Exam  Constitutional: She is oriented to person, place, and time. She appears well-developed and well-nourished. No distress.  HENT:  Head: Normocephalic.  Mouth/Throat: No  oropharyngeal exudate.  Eyes: Conjunctivae are normal. Pupils are equal, round, and reactive to light.  Neck: Normal range of motion. Neck supple.  Cardiovascular: Normal rate, regular rhythm, normal heart sounds and intact distal pulses.   Pulmonary/Chest: Effort normal and breath sounds normal. No respiratory distress. She has no wheezes. She has no rales. She exhibits tenderness.  Abdominal: Soft. She exhibits no distension. There is no tenderness.  Musculoskeletal: She exhibits no edema.  Neurological: She is alert and oriented to person, place, and time.  Skin: Skin is warm. No rash noted.  Nursing note and vitals reviewed.   ED Course  Procedures (including critical care time) Labs Review Labs Reviewed  COMPREHENSIVE METABOLIC PANEL - Abnormal; Notable for the following:    Potassium 2.9 (*)    Glucose, Bld 117 (*)    Total Protein 6.4 (*)    Albumin 3.4 (*)    GFR calc non Af Amer 58 (*)    All other components within normal limits  LIPASE, BLOOD - Abnormal; Notable for the following:    Lipase 76 (*)    All other components within normal limits  CBC WITH DIFFERENTIAL/PLATELET - Abnormal; Notable for the following:    Neutro Abs 8.4 (*)    Lymphs Abs 0.6 (*)    All other components within normal limits  TROPONIN I  I-STAT TROPOININ, ED    Imaging Review Dg Chest 2 View  04/13/2016  CLINICAL DATA:  Numbness down left arm to finger tips. Left chest pain for 1 day with dizziness. EXAM: CHEST  2 VIEW COMPARISON:  07/14/2014. FINDINGS: Surgical clips are seen in the expected location of the thyroid. Trachea is midline. Heart is at the upper limits of normal in size. Thoracic aorta is calcified. Lungs are clear. No pleural fluid. IMPRESSION: No acute findings. Electronically Signed   By: Lorin Picket M.D.   On: 04/13/2016 15:58   Ct Head Wo Contrast  04/13/2016  CLINICAL DATA:  Left arm numbness and tingling for 2 days. Recent chest discomfort. History of hypertension and  hyperlipidemia. EXAM: CT HEAD WITHOUT CONTRAST TECHNIQUE: Contiguous axial images were obtained from the base of the skull through the vertex without intravenous contrast. COMPARISON:  CT head dated 04/04/2006. FINDINGS: Brain: Mild generalized age-related parenchymal atrophy with commensurate dilatation of the ventricles and sulci. There is no mass, hemorrhage, edema or other evidence of acute parenchymal abnormality. No extra-axial hemorrhage. Vascular: No hyperdense vessel or unexpected calcification. There are chronic calcified atherosclerotic changes of the large vessels at the skull base. Skull: Negative for fracture or focal lesion. Sinuses/Orbits: No acute findings. Other: None. IMPRESSION: Negative head CT.  No intracranial mass, hemorrhage or edema. Electronically Signed   By: Franki Cabot M.D.   On: 04/13/2016 18:00   I have personally reviewed and evaluated these images and lab results as part of my medical decision-making.   EKG Interpretation   Date/Time:  Saturday April 13 2016 14:23:55 EDT Ventricular Rate:  77 PR Interval:    QRS Duration: 90  QT Interval:  384 QTC Calculation: 435 R Axis:   84 Text Interpretation:  Sinus rhythm Borderline right axis deviation No  significant change since last tracing Confirmed by Nuriya Stuck MD, Lysbeth Galas  [phone]) on 04/13/2016 3:40:08 PM      MDM  75 yo AAF with PMHx of HTN, HLD who p/w atypical chest pain, transient hypotension in setting of nitroglycerin use, as well as left arm numbness/tingling. No focal neuro deficits on exam. VS now stable and WNL - suspect hypotension was 2/2 nitroglycerin. History is somewhat atypical for ACS but pt is high risk, with HEAR score 5. No current signs of ischemia on EKG. Initial labs unremarkable with normal CXR, negative trop, BMP with hypokalemia which has been repleted. Pain is improving. Given high risk history, will admit for serial trop, possible provocative testing. Do not suspect dissection as pt is not  sharp, tearing, and pulses symmetric. No h/o PE and pt not hypoxic, tachypneic, or tachyardic. Will admit to medicine.  Clinical Impression: 1. Chest pain, unspecified chest pain type   2. Hyperlipidemia   3. Essential hypertension   4. Hypokalemia     Disposition: Admit  Condition: Stable   Duffy Bruce, MD 04/13/16 [phone]

## 2016-04-13 NOTE — Progress Notes (Signed)
Patient lying in bed, no needs at this time. Did advise patient to not get up without assistance. Call light within reach.

## 2016-04-13 NOTE — ED Notes (Signed)
Pt to ER BIB [facility] from [facility] where patient was being seen for left upper chest pain described as sharp and sore with radiation to left arm with numbness. Was given 1 nitro by office which took pressure from 140/52 down to 70/30, pt received 300 cc NS in route which has BP to 124/57. Received 324 mg aspirin PTA. Pt is alert and oriented x4. 12 lead unremarkable.

## 2016-04-13 NOTE — H&P (Signed)
History and Physical  [name] [id number] DOB: 09/03/1941 DOA: 04/13/2016   PCP: Robin Nettle, MD  Referring Physician: ED/ Dr. Ellender Hose  Patient coming from: Home  Chief Complaint: Chest pain  HPI:  [name] is a 75 y.o. female with medical history of hypertension, hyperlipidemia presented with 2 day history of left arm tingling and discomfort that began on 04/11/2016. She stated that nothing in particular made better or worse. However, on the evening of 04/12/2016, the patient developed dull left-sided chest discomfort while she was sitting on the couch that was waxing and waning in nature. She has not had any worsening dyspnea or exertional chest discomfort. Throughout the night and in the morning, the patient continued to have intermittent chest discomfort with continued left arm numbness and discomfort. As a result she presented to urgent care. The patient was given nitroglycerin sublingual which relieved her chest discomfort, but caused her to be hypotensive with a systolic blood pressure down to 70. The patient was given a 250 mL fluid bolus which improved her blood pressure. The patient states that her father died at the age of 27 secondary to MI.  In the emergency department, the patient was afebrile and hemodynamically stable with oxygen saturation 99% on room air. EKG shows sinus rhythm without concerning ST-T changes.  Chest x-ray was negative. Initial troponin was negative. Potassium was 2.9. The patient was given 10 mEq of potassium IV and 40 mEq po in the emergency department and 500cc fluid bolus.  Assessment/Plan: Atypical chest pain -However was relieved with nitroglycerin -Cycle troponins -EKG without concerning ischemic changes -Echocardiogram -Aspirin 325 mg daily  Hypertension -Restart amlodipine/benazepril in am (already had dose today)  Hypokalemia -repleted -check mag  Hyperlipidemia -continue fenofibrate and  Zetia -am lipid panel        Past Medical History  Diagnosis Date  . Hypertension   . Hyperlipemia   . Osteoarthritis    Past Surgical History  Procedure Laterality Date  . Total hip arthroplasty  8/11    left  . Shoulder arthroscopy  08,09    right and left  . Back surgery      lumb 1999  . Abdominal hysterectomy    . Total hip arthroplasty  2010    left  . Colonoscopy    . Fracture surgery      rt foot  . Foot arthrodesis, modified mcbride  2002    rt   . Carpal tunnel release  06/12/2012    Procedure: CARPAL TUNNEL RELEASE;  Surgeon: Wynonia Sours, MD;  Location: [place];  Service: Orthopedics;  Laterality: Right;   Social History:  reports that she has never smoked. She does not have any smokeless tobacco history on file. She reports that she does not drink alcohol or use illicit drugs.   History reviewed. No pertinent family history.   Allergies  Allergen Reactions  . Ciprofloxacin Shortness Of Breath  . Codeine Shortness Of Breath  . Hydrocodone Shortness Of Breath  . Morphine And Related Shortness Of Breath  . Oxycodone Shortness Of Breath  . Penicillins Shortness Of Breath    Has patient had a PCN reaction causing immediate rash, facial/tongue/throat swelling, SOB or lightheadedness with hypotension: Yes Has patient had a PCN reaction causing severe rash involving mucus membranes or skin necrosis: No Has patient had a PCN reaction that required hospitalization No Has patient had a PCN reaction occurring within the last 10 years: No If all of  the above answers are "NO", then may proceed with Cephalosporin use.   [name] [Aspirin]     Gi upset  . Nsaids     Gi upset  . Other Other (See Comments)    Can't eat nuts and seeds because of diverticulitis  . Phenergan [Promethazine Hcl] Nausea And Vomiting    headache  . Gabapentin Itching and Rash    Can tolerate 300 mg at bedtime, but no higher doses  . Tramadol Itching, Nausea And  Vomiting and Rash     Prior to Admission medications   Medication Sig Start Date End Date Taking? Authorizing Provider  amLODipine-benazepril (LOTREL) 10-20 MG per capsule Take 1 capsule by mouth daily.   Yes Historical Provider, MD  aspirin 81 MG tablet Take 81 mg by mouth every morning.    Yes Historical Provider, MD  ezetimibe (ZETIA) 10 MG tablet Take 10 mg by mouth daily.   Yes Historical Provider, MD  fenofibrate micronized (LOFIBRA) 134 MG capsule Take 134 mg by mouth daily before breakfast.   Yes Historical Provider, MD  hydrocortisone cream 1 % Apply 1 application topically 3 (three) times daily as needed for itching.   Yes Historical Provider, MD    Review of Systems:  Constitutional:  No weight loss, night sweats, Fevers, chills, fatigue.  Head&Eyes: No headache.  No vision loss.  No eye pain or scotoma ENT:  No Difficulty swallowing,Tooth/dental problems,Sore throat,  No ear ache, post nasal drip,  Cardio-vascular:  No  Orthopnea, PND, swelling in lower extremities,  dizziness, palpitations  GI:  No  abdominal pain, nausea, vomiting, diarrhea, loss of appetite, hematochezia, melena, heartburn, indigestion, Resp:  No shortness of breath with exertion or at rest. No cough. No coughing up of blood .No wheezing.No chest wall deformity  Skin:  no rash or lesions.  GU:  no dysuria, change in color of urine, no urgency or frequency. No flank pain.  Musculoskeletal:  No joint pain or swelling. No decreased range of motion. No back pain.  Psych:  No change in mood or affect. No depression or anxiety. Neurologic: No headache, no dysesthesia, no focal weakness, no vision loss. No syncope  Physical Exam: Filed Vitals:   04/13/16 1445 04/13/16 1500 04/13/16 1506 04/13/16 1617  BP: 128/61 140/68 140/68 140/69  Pulse: 77 80 80 78  Resp: 16 22 22    SpO2: 99% 98% 97% 99%   General:  A&O x 3, NAD, nontoxic, pleasant/cooperative Head/Eye: No conjunctival hemorrhage, no  icterus, [place]/AT, No nystagmus ENT:  No icterus,  No thrush, good dentition, no pharyngeal exudate Neck:  No masses, no lymphadenpathy, no bruits CV:  RRR, no rub, no gallop, no S3 Lung:  CTAB, good air movement, no wheeze, no rhonchi Abdomen: soft/NT, +BS, nondistended, no peritoneal signs Ext: No cyanosis, No rashes, No petechiae, No lymphangitis, No edema Neuro: CNII-XII intact, strength 4/5 in bilateral upper and lower extremities, no dysmetria  Labs on Admission:  Basic Metabolic Panel:  Recent Labs Lab 04/13/16 1415  NA 140  K 2.9*  CL 105  CO2 27  GLUCOSE 117*  BUN 9  CREATININE 0.94  CALCIUM 9.3   Liver Function Tests:  Recent Labs Lab 04/13/16 1415  AST 26  ALT 22  ALKPHOS 76  BILITOT 0.5  PROT 6.4*  ALBUMIN 3.4*    Recent Labs Lab 04/13/16 1415  LIPASE 76*   No results for input(s): AMMONIA in the last 168 hours. CBC:  Recent Labs Lab 04/13/16 1415  WBC 9.9  NEUTROABS 8.4*  HGB 14.5  HCT 42.6  MCV 89.5  PLT 310   Coagulation Profile: No results for input(s): INR, PROTIME in the last 168 hours. Cardiac Enzymes:  Recent Labs Lab 04/13/16 1415  TROPONINI <0.03   BNP: Invalid input(s): POCBNP CBG: No results for input(s): GLUCAP in the last 168 hours. Urine analysis:    Component Value Date/Time   COLORURINE AMBER BIOCHEMICALS MAY BE AFFECTED BY COLOR* 01/13/2011 1900   APPEARANCEUR CLOUDY* 01/13/2011 1900   LABSPEC 1.024 01/13/2011 1900   PHURINE 5.5 01/13/2011 1900   GLUCOSEU 100* 01/13/2011 1900   HGBUR SMALL* 01/13/2011 1900   BILIRUBINUR NEGATIVE 01/13/2011 1900   KETONESUR NEGATIVE 01/13/2011 1900   PROTEINUR NEGATIVE 01/13/2011 1900   UROBILINOGEN 1.0 01/13/2011 1900   NITRITE POSITIVE* 01/13/2011 1900   LEUKOCYTESUR MODERATE* 01/13/2011 1900   Sepsis Labs: @LABRCNTIP (procalcitonin:4,lacticidven:4) )No results found for this or any previous visit (from the past 240 hour(s)).   Radiological Exams on Admission: Dg Chest  2 View  04/13/2016  CLINICAL DATA:  Numbness down left arm to finger tips. Left chest pain for 1 day with dizziness. EXAM: CHEST  2 VIEW COMPARISON:  07/14/2014. FINDINGS: Surgical clips are seen in the expected location of the thyroid. Trachea is midline. Heart is at the upper limits of normal in size. Thoracic aorta is calcified. Lungs are clear. No pleural fluid. IMPRESSION: No acute findings. Electronically Signed   By: Lorin Picket M.D.   On: 04/13/2016 15:58    EKG: Independently reviewed. Sinus rhythm, no concerning ST-T wave changes    Time spent:50 minutes Code Status:   FULL Family Communication:  No Family at bedside Disposition Plan: expect 1-2 day hospitalization Consults called: cardiology DVT Prophylaxis:  Lovenox  Maryl Blalock, DO  [facility] Pager [phone]  If 7PM-7AM, please contact night-coverage [email or web address] Password [facility] 04/13/2016, 5:04 PM

## 2016-04-14 ENCOUNTER — Encounter (HOSPITAL_COMMUNITY): Payer: Self-pay | Admitting: Internal Medicine

## 2016-04-14 ENCOUNTER — Other Ambulatory Visit: Payer: Self-pay | Admitting: Cardiology

## 2016-04-14 ENCOUNTER — Observation Stay (HOSPITAL_BASED_OUTPATIENT_CLINIC_OR_DEPARTMENT_OTHER): Payer: Medicare Other

## 2016-04-14 DIAGNOSIS — R079 Chest pain, unspecified: Secondary | ICD-10-CM | POA: Diagnosis not present

## 2016-04-14 DIAGNOSIS — I10 Essential (primary) hypertension: Secondary | ICD-10-CM

## 2016-04-14 DIAGNOSIS — R0789 Other chest pain: Secondary | ICD-10-CM

## 2016-04-14 DIAGNOSIS — E785 Hyperlipidemia, unspecified: Secondary | ICD-10-CM | POA: Diagnosis not present

## 2016-04-14 LAB — BASIC METABOLIC PANEL
Anion gap: 8 (ref 5–15)
BUN: 7 mg/dL (ref 6–20)
CHLORIDE: 107 mmol/L (ref 101–111)
CO2: 25 mmol/L (ref 22–32)
Calcium: 9 mg/dL (ref 8.9–10.3)
Creatinine, Ser: 0.69 mg/dL (ref 0.44–1.00)
GFR calc Af Amer: >60 mL/min (ref >60)
GFR calc non Af Amer: >60 mL/min (ref >60)
GLUCOSE: 92 mg/dL (ref 65–99)
POTASSIUM: 3 mmol/L — AB (ref 3.5–5.1)
Sodium: 140 mmol/L (ref 135–145)

## 2016-04-14 LAB — LIPID PANEL
CHOL/HDL RATIO: 5.4 ratio
Cholesterol: 163 mg/dL (ref 0–200)
HDL: 30 mg/dL — ABNORMAL LOW (ref >40)
LDL Cholesterol: 104 mg/dL — ABNORMAL HIGH (ref 0–99)
Triglycerides: 144 mg/dL (ref <150)
VLDL: 29 mg/dL (ref 0–40)

## 2016-04-14 LAB — ECHOCARDIOGRAM COMPLETE
HEIGHTINCHES: 70 in
WEIGHTICAEL: 3115.2 [oz_av]

## 2016-04-14 LAB — TROPONIN I

## 2016-04-14 MED ORDER — ASPIRIN 81 MG PO CHEW: 81.0000 mg | CHEWABLE_TABLET | Freq: Every day | ORAL | Status: DC

## 2016-04-14 MED ORDER — POTASSIUM CHLORIDE CRYS ER 20 MEQ PO TBCR: 40.0000 meq | EXTENDED_RELEASE_TABLET | Freq: Once | ORAL | Status: DC

## 2016-04-14 MED ORDER — REGADENOSON 0.4 MG/5ML IV SOLN: 0.4000 mg | Freq: Once | INTRAVENOUS | Status: DC

## 2016-04-14 NOTE — Progress Notes (Signed)
Echocardiogram 2D Echocardiogram has been performed.  [name] 04/14/2016, 11:24 AM

## 2016-04-14 NOTE — Discharge Summary (Signed)
Physician Discharge Summary  [name] [id number] DOB: January 16, 1941 DOA: 04/13/2016  PCP: Patricia Nettle, MD  Admit date: 04/13/2016 Discharge date: 04/14/2016  Time spent: 35 minutes  Recommendations for Outpatient Follow-up:   1. Patient presenting with atypical chest pain, case was discussed with Dr Harrington Challenger of cardiology agreed with outpatient stress test   Discharge Diagnoses:  Active Problems:   Chest pain   Hyperlipidemia   Essential hypertension   Discharge Condition: Stable  Diet recommendation: Heart healthy  Filed Weights   04/14/16 0455  Weight: 88.3 kg (194 lb 11.2 oz)    History of present illness:  [name] is a 75 y.o. female with medical history of hypertension, hyperlipidemia presented with 2 day history of left arm tingling and discomfort that began on 04/11/2016. She stated that nothing in particular made better or worse. However, on the evening of 04/12/2016, the patient developed dull left-sided chest discomfort while she was sitting on the couch that was waxing and waning in nature. She has not had any worsening dyspnea or exertional chest discomfort. Throughout the night and in the morning, the patient continued to have intermittent chest discomfort with continued left arm numbness and discomfort. As a result she presented to urgent care. The patient was given nitroglycerin sublingual which relieved her chest discomfort, but caused her to be hypotensive with a systolic blood pressure down to 70. The patient was given a 250 mL fluid bolus which improved her blood pressure. The patient states that her father died at the age of 15 secondary to MI.  Hospital Course:  [name] is a 75 year old female with a past medical history of hypertension and dyslipidemia, presented to the emergency room on 04/13/2016 with complaints of chest pain having atypical features. On physical examination symptoms were reproducible to  palpation. She did not recall injuring her chest. 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 EKG did not reveal acute ischemic changes. She was monitored overnight with troponins being cycled and remained negative. By the following day she reported feeling much better and expressed wishes to go home. I discussed case with Dr. Harrington Challenger of cardiology who agreed with setting her up with outpatient stress test. She was discharged home in stable condition  Procedures:  Transthoracic echocardiogram impression: - Left ventricle: The cavity size was normal. Wall thickness was   normal. Systolic function was normal. The estimated ejection   fraction was in the range of 60% to 65%. Wall motion was normal;   there were no regional wall motion abnormalities. Doppler   parameters are consistent with abnormal left ventricular   relaxation (grade 1 diastolic dysfunction). - Mitral valve: There was mild regurgitation. - Left atrium: The atrium was mildly dilated.  Consultations:  Cardiology  Discharge Exam: Vitals:   04/13/16 1949 04/14/16 0623  BP: (!) 127/58 (!) 153/71  Pulse: 72 69  Resp: 18 18  Temp: 98.8 F (37.1 C) 98 F (36.7 C)    General: No acute distress, awake and alert, anxious to go home Cardiovascular: Regular rate and rhythm normal S1-S2 Respiratory: Normal respiratory effort lungs are clear Abdomen: Soft nontender nondistended Extremities: No edema  Discharge Instructions   Discharge Instructions    Call MD for:    Complete by:  As directed   Call MD for:    Complete by:  As directed   Call MD for:  difficulty breathing, headache or visual disturbances    Complete by:  As directed   Call MD for:  difficulty  breathing, headache or visual disturbances    Complete by:  As directed   Call MD for:  extreme fatigue    Complete by:  As directed   Call MD for:  extreme fatigue    Complete by:  As directed   Call MD for:  hives    Complete by:  As directed   Call MD for:   hives    Complete by:  As directed   Call MD for:  persistant dizziness or light-headedness    Complete by:  As directed   Call MD for:  persistant dizziness or light-headedness    Complete by:  As directed   Call MD for:  persistant nausea and vomiting    Complete by:  As directed   Call MD for:  persistant nausea and vomiting    Complete by:  As directed   Call MD for:  redness, tenderness, or signs of infection (pain, swelling, redness, odor or green/yellow discharge around incision site)    Complete by:  As directed   Call MD for:  redness, tenderness, or signs of infection (pain, swelling, redness, odor or green/yellow discharge around incision site)    Complete by:  As directed   Call MD for:  severe uncontrolled pain    Complete by:  As directed   Call MD for:  severe uncontrolled pain    Complete by:  As directed   Call MD for:  temperature >100.4    Complete by:  As directed   Call MD for:  temperature >100.4    Complete by:  As directed   Diet - low sodium heart healthy    Complete by:  As directed   Diet - low sodium heart healthy    Complete by:  As directed   Increase activity slowly    Complete by:  As directed   Increase activity slowly    Complete by:  As directed     Current Discharge Medication List    CONTINUE these medications which have NOT CHANGED   Details  amLODipine-benazepril (LOTREL) 10-20 MG per capsule Take 1 capsule by mouth daily.    aspirin 81 MG tablet Take 81 mg by mouth every morning.     ezetimibe (ZETIA) 10 MG tablet Take 10 mg by mouth daily.    fenofibrate micronized (LOFIBRA) 134 MG capsule Take 134 mg by mouth daily before breakfast.    hydrocortisone cream 1 % Apply 1 application topically 3 (three) times daily as needed for itching.       Allergies  Allergen Reactions  . Ciprofloxacin Shortness Of Breath  . Codeine Shortness Of Breath  . Hydrocodone Shortness Of Breath  . Morphine And Related Shortness Of Breath  . Oxycodone Shortness  Of Breath  . Penicillins Shortness Of Breath    Has patient had a PCN reaction causing immediate rash, facial/tongue/throat swelling, SOB or lightheadedness with hypotension: Yes Has patient had a PCN reaction causing severe rash involving mucus membranes or skin necrosis: No Has patient had a PCN reaction that required hospitalization No Has patient had a PCN reaction occurring within the last 10 years: No If all of the above answers are "NO", then may proceed with Cephalosporin use.   [name] [Aspirin]     Gi upset  . Nsaids     Gi upset  . Other Other (See Comments)    Can't eat nuts and seeds because of diverticulitis  . Phenergan [Promethazine Hcl] Nausea And Vomiting  headache  . Gabapentin Itching and Rash    Can tolerate 300 mg at bedtime, but no higher doses  . Tramadol Itching, Nausea And Vomiting and Rash      The results of significant diagnostics from this hospitalization (including imaging, microbiology, ancillary and laboratory) are listed below for reference.    Significant Diagnostic Studies: Dg Chest 2 View  Result Date: 04/13/2016 CLINICAL DATA:  Numbness down left arm to finger tips. Left chest pain for 1 day with dizziness. EXAM: CHEST  2 VIEW COMPARISON:  07/14/2014. FINDINGS: Surgical clips are seen in the expected location of the thyroid. Trachea is midline. Heart is at the upper limits of normal in size. Thoracic aorta is calcified. Lungs are clear. No pleural fluid. IMPRESSION: No acute findings. Electronically Signed   By: Lorin Picket M.D.   On: 04/13/2016 15:58   Ct Head Wo Contrast  Result Date: 04/13/2016 CLINICAL DATA:  Left arm numbness and tingling for 2 days. Recent chest discomfort. History of hypertension and hyperlipidemia. EXAM: CT HEAD WITHOUT CONTRAST TECHNIQUE: Contiguous axial images were obtained from the base of the skull through the vertex without intravenous contrast. COMPARISON:  CT head dated 04/04/2006. FINDINGS: Brain: Mild  generalized age-related parenchymal atrophy with commensurate dilatation of the ventricles and sulci. There is no mass, hemorrhage, edema or other evidence of acute parenchymal abnormality. No extra-axial hemorrhage. Vascular: No hyperdense vessel or unexpected calcification. There are chronic calcified atherosclerotic changes of the large vessels at the skull base. Skull: Negative for fracture or focal lesion. Sinuses/Orbits: No acute findings. Other: None. IMPRESSION: Negative head CT.  No intracranial mass, hemorrhage or edema. Electronically Signed   By: Franki Cabot M.D.   On: 04/13/2016 18:00    Microbiology: No results found for this or any previous visit (from the past 240 hour(s)).   Labs: Basic Metabolic Panel:  Recent Labs Lab 04/13/16 1415 04/13/16 2007 04/14/16 0228  NA 140  --  140  K 2.9*  --  3.0*  CL 105  --  107  CO2 27  --  25  GLUCOSE 117*  --  92  BUN 9  --  7  CREATININE 0.94  --  0.69  CALCIUM 9.3  --  9.0  MG  --  2.3  --    Liver Function Tests:  Recent Labs Lab 04/13/16 1415  AST 26  ALT 22  ALKPHOS 76  BILITOT 0.5  PROT 6.4*  ALBUMIN 3.4*    Recent Labs Lab 04/13/16 1415  LIPASE 76*   No results for input(s): AMMONIA in the last 168 hours. CBC:  Recent Labs Lab 04/13/16 1415  WBC 9.9  NEUTROABS 8.4*  HGB 14.5  HCT 42.6  MCV 89.5  PLT 310   Cardiac Enzymes:  Recent Labs Lab 04/13/16 1415 04/13/16 2007 04/14/16 0228  TROPONINI <0.03 <0.03 <0.03   BNP: BNP (last 3 results) No results for input(s): BNP in the last 8760 hours.  ProBNP (last 3 results) No results for input(s): PROBNP in the last 8760 hours.  CBG: No results for input(s): GLUCAP in the last 168 hours.     Signed:  Kelvin Cellar MD.  [facility] 04/14/2016, 1:43 PM

## 2016-04-14 NOTE — Progress Notes (Signed)
Discharged to home with family office visits in place teaching done  

## 2016-04-14 NOTE — Consult Note (Signed)
Cardiology Consult    Patient ID: [name] MRN: [id number], DOB/AGE: 74-Jun-1942  Admit date: 04/13/2016 Date of Consult: 04/14/2016 Primary Physician: Patricia Nettle, MD Requesting Provider:  Coralyn Pear  CC:  CP  Patient Profile    75 o F w a a h/o HTN, HLD, & Tobacco abuse presented to the ED yesterday at 14:13 with CP that started 2 days prior while at rest as she had been recovering from an injection for back pain 9 days prior.  There was no association of the pain with physical activity, position, or deep breaths.  The CP was described as a nearly constant, 7/10, left-sided dullness that radiated into her left arm.  Her daughter took her to urgent care on Friday where she was given NTG, which resulted in symptomatic hypotension with a SBP of 70 for which she required a fluid bolus of 250 mL.  On arrival to the ED, her EKG was unremarkable, & her cardiac enzymes were negative.  She was noted to have hyperkalemia.  She endorsed diarrhea for several days prior to her presentation.  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    Past Medical History   Past Medical History:  Diagnosis Date  . Hyperlipemia   . Hypertension   . Osteoarthritis     Past Surgical History:  Procedure Laterality Date  . ABDOMINAL HYSTERECTOMY    . BACK SURGERY     lumb 1999  . CARPAL TUNNEL RELEASE  06/12/2012   Procedure: CARPAL TUNNEL RELEASE;  Surgeon: Wynonia Sours, MD;  Location: [facility];  Service: Orthopedics;  Laterality: Right;  . COLONOSCOPY    . FOOT ARTHRODESIS, MODIFIED MCBRIDE  2002   rt   . FRACTURE SURGERY     rt foot  . SHOULDER ARTHROSCOPY  08,09   right and left  . TOTAL HIP ARTHROPLASTY  8/11   left  . TOTAL HIP ARTHROPLASTY  2010   left    Allergies  Allergies  Allergen Reactions  . Ciprofloxacin Shortness Of Breath  . Codeine Shortness  Of Breath  . Hydrocodone Shortness Of Breath  . Morphine And Related Shortness Of Breath  . Oxycodone Shortness Of Breath  . Penicillins Shortness Of Breath    Has patient had a PCN reaction causing immediate rash, facial/tongue/throat swelling, SOB or lightheadedness with hypotension: Yes Has patient had a PCN reaction causing severe rash involving mucus membranes or skin necrosis: No Has patient had a PCN reaction that required hospitalization No Has patient had a PCN reaction occurring within the last 10 years: No If all of the above answers are "NO", then may proceed with Cephalosporin use.   [name] [Aspirin]     Gi upset  . Nsaids     Gi upset  . Other Other (See Comments)    Can't eat nuts and seeds because of diverticulitis  . Phenergan [Promethazine Hcl] Nausea And Vomiting    headache  . Gabapentin Itching and Rash    Can tolerate 300 mg at bedtime, but no higher doses  . Tramadol Itching, Nausea And Vomiting and Rash   Inpatient Medications    . amLODipine  10 mg Oral Daily   And  . benazepril  20 mg Oral Daily  . aspirin  81 mg Oral Daily  . enoxaparin (LOVENOX) injection  40 mg Subcutaneous Q24H  . ezetimibe  10 mg Oral Daily  . fenofibrate  54 mg Oral Daily   Family History    Family History  Problem Relation Age of Onset  . Coronary artery disease Father 19  . Heart failure Father    Social History    Social History   Social History  . Marital status: Married    Spouse name: N/A  . Number of children: 2  . Years of education: N/A   Occupational History  . Accountant   . [employer]    Social History Main Topics  . Smoking status: Current Every Day Smoker    Types: Cigarettes  . Smokeless tobacco: Not on file     Comment: 3-4 cigarettes per day since age 39  . Alcohol use No  . Drug use: No  . Sexual activity: Not on file   Other Topics Concern  . Not on file   Social History Narrative  . No narrative on file      Review of Systems    General:  No chills, fever, night sweats or weight changes.  Cardiovascular:  Positive for chest pain.  No dyspnea on exertion, edema, orthopnea, palpitations, paroxysmal nocturnal dyspnea. Dermatological: No rash, lesions/masses Respiratory: No cough, dyspnea Urologic: No hematuria, dysuria Abdominal:   No nausea, vomiting, diarrhea, bright red blood per rectum, melena, or hematemesis Neurologic:  No visual changes, wkns, changes in mental status. All other systems reviewed and are otherwise negative except as noted above.  Physical Exam    Blood pressure (!) 153/71, pulse 69, temperature 98 F (36.7 C), temperature source Oral, resp. rate 18, height 5\' 10"  (1.778 m), weight 88.3 kg (194 lb 11.2 oz), SpO2 95 %.  General: Pleasant, NAD Psych: Normal affect. Neuro: Alert and oriented X 3. Moves all extremities spontaneously. HEENT: Normal  Neck: Supple without bruits or JVD. Lungs:  Resp regular and unlabored, CTA. Heart: RRR no s3, s4, or murmurs. Abdomen: Soft, non-tender, non-distended, BS + x 4.  Extremities: No clubbing, cyanosis or edema. DP/PT/Radials 2+ and equal bilaterally.  Labs    Troponin [facility] of Care Test)  Recent Labs  04/13/16 1437  TROPIPOC 0.00    Recent Labs  04/13/16 1415 04/13/16 2007 04/14/16 0228  TROPONINI <0.03 <0.03 <0.03   Lab Results  Component Value Date   WBC 9.9 04/13/2016   HGB 14.5 04/13/2016   HCT 42.6 04/13/2016   MCV 89.5 04/13/2016   PLT 310 04/13/2016    Recent Labs Lab 04/13/16 1415 04/14/16 0228  NA 140 140  K 2.9* 3.0*  CL 105 107  CO2 27 25  BUN 9 7  CREATININE 0.94 0.69  CALCIUM 9.3 9.0  PROT 6.4*  --   BILITOT 0.5  --   ALKPHOS 76  --   ALT 22  --   AST 26  --   GLUCOSE 117* 92   Lab Results  Component Value Date   CHOL 163 04/14/2016   HDL 30 (L) 04/14/2016   LDLCALC 104 (H) 04/14/2016   TRIG 144 04/14/2016   No results found for: [facility]   Radiology Studies    Dg Chest 2  View  Result Date: 04/13/2016 CLINICAL DATA:  Numbness down left arm to finger tips. Left chest pain for 1 day with dizziness. EXAM: CHEST  2 VIEW COMPARISON:  07/14/2014. FINDINGS: Surgical clips are seen in the expected location of the thyroid. Trachea is midline. Heart is at the upper limits of normal in size. Thoracic aorta  is calcified. Lungs are clear. No pleural fluid. IMPRESSION: No acute findings. Electronically Signed   By: Lorin Picket M.D.   On: 04/13/2016 15:58   Ct Head Wo Contrast  Result Date: 04/13/2016 CLINICAL DATA:  Left arm numbness and tingling for 2 days. Recent chest discomfort. History of hypertension and hyperlipidemia. EXAM: CT HEAD WITHOUT CONTRAST TECHNIQUE: Contiguous axial images were obtained from the base of the skull through the vertex without intravenous contrast. COMPARISON:  CT head dated 04/04/2006. FINDINGS: Brain: Mild generalized age-related parenchymal atrophy with commensurate dilatation of the ventricles and sulci. There is no mass, hemorrhage, edema or other evidence of acute parenchymal abnormality. No extra-axial hemorrhage. Vascular: No hyperdense vessel or unexpected calcification. There are chronic calcified atherosclerotic changes of the large vessels at the skull base. Skull: Negative for fracture or focal lesion. Sinuses/Orbits: No acute findings. Other: None. IMPRESSION: Negative head CT.  No intracranial mass, hemorrhage or edema. Electronically Signed   By: Franki Cabot M.D.   On: 04/13/2016 18:00   Assessment & Plan    89 o F w a a h/o HTN, HLD, & Tobacco abuse p/w atypical CP, unremarkable EKG, & troponin negative x 3.    # Atypical CP - Now nearly resolved.   - We agree with the need for stress testing, but musculoskeletal etiology should also be considered given her profound left-sided TTP.  If we are not able to coordinate this over the weekend, we may discuss coordination of this on an outpatient basis. - We agree with daily ASA for now  but will decrease it to 81 mg daily, particularly with her prior GI upset.    # h/o HTN - She has been normotensive to hypertensive since arrival. - We agree with re-initiation of her home Amlodipine-Benazepril.    # h/o HLD - We agree with continuation of her home Fenofibrate & Ezetimibe as well as rechecking her lipid panel.  Signed, Alfonso Ramus, MD 04/14/2016, 9:39 AM

## 2016-04-18 DIAGNOSIS — M5116 Intervertebral disc disorders with radiculopathy, lumbar region: Secondary | ICD-10-CM | POA: Diagnosis not present

## 2016-04-18 DIAGNOSIS — M545 Low back pain: Secondary | ICD-10-CM | POA: Diagnosis not present

## 2016-04-22 DIAGNOSIS — M549 Dorsalgia, unspecified: Secondary | ICD-10-CM | POA: Diagnosis not present

## 2016-04-29 DIAGNOSIS — M5116 Intervertebral disc disorders with radiculopathy, lumbar region: Secondary | ICD-10-CM | POA: Diagnosis not present

## 2016-04-29 DIAGNOSIS — M545 Low back pain: Secondary | ICD-10-CM | POA: Diagnosis not present

## 2016-05-23 DIAGNOSIS — M5116 Intervertebral disc disorders with radiculopathy, lumbar region: Secondary | ICD-10-CM | POA: Diagnosis not present

## 2016-05-23 DIAGNOSIS — M545 Low back pain: Secondary | ICD-10-CM | POA: Diagnosis not present

## 2016-07-17 DIAGNOSIS — M549 Dorsalgia, unspecified: Secondary | ICD-10-CM | POA: Diagnosis not present

## 2017-01-07 DIAGNOSIS — E785 Hyperlipidemia, unspecified: Secondary | ICD-10-CM | POA: Diagnosis not present

## 2017-01-07 DIAGNOSIS — I10 Essential (primary) hypertension: Secondary | ICD-10-CM | POA: Diagnosis not present

## 2017-01-07 DIAGNOSIS — Z0000 Encounter for general adult medical examination without abnormal findings: Secondary | ICD-10-CM | POA: Diagnosis not present

## 2017-01-07 DIAGNOSIS — N3281 Overactive bladder: Secondary | ICD-10-CM | POA: Diagnosis not present

## 2017-02-11 DIAGNOSIS — R300 Dysuria: Secondary | ICD-10-CM | POA: Diagnosis not present

## 2017-05-12 DIAGNOSIS — N390 Urinary tract infection, site not specified: Secondary | ICD-10-CM | POA: Diagnosis not present

## 2017-05-12 DIAGNOSIS — E559 Vitamin D deficiency, unspecified: Secondary | ICD-10-CM | POA: Diagnosis not present

## 2017-05-12 DIAGNOSIS — E785 Hyperlipidemia, unspecified: Secondary | ICD-10-CM | POA: Diagnosis not present

## 2017-05-12 DIAGNOSIS — N3281 Overactive bladder: Secondary | ICD-10-CM | POA: Diagnosis not present

## 2017-05-12 DIAGNOSIS — I10 Essential (primary) hypertension: Secondary | ICD-10-CM | POA: Diagnosis not present

## 2017-05-19 DIAGNOSIS — E559 Vitamin D deficiency, unspecified: Secondary | ICD-10-CM | POA: Diagnosis not present

## 2017-05-19 DIAGNOSIS — E785 Hyperlipidemia, unspecified: Secondary | ICD-10-CM | POA: Diagnosis not present

## 2017-05-19 DIAGNOSIS — N3281 Overactive bladder: Secondary | ICD-10-CM | POA: Diagnosis not present

## 2017-05-19 DIAGNOSIS — I10 Essential (primary) hypertension: Secondary | ICD-10-CM | POA: Diagnosis not present

## 2017-05-21 ENCOUNTER — Other Ambulatory Visit: Payer: Self-pay | Admitting: Internal Medicine

## 2017-05-21 DIAGNOSIS — Z1231 Encounter for screening mammogram for malignant neoplasm of breast: Secondary | ICD-10-CM

## 2017-05-26 DIAGNOSIS — Z1211 Encounter for screening for malignant neoplasm of colon: Secondary | ICD-10-CM | POA: Diagnosis not present

## 2017-05-26 DIAGNOSIS — Z1212 Encounter for screening for malignant neoplasm of rectum: Secondary | ICD-10-CM | POA: Diagnosis not present

## 2017-05-30 ENCOUNTER — Ambulatory Visit
Admission: RE | Admit: 2017-05-30 | Discharge: 2017-05-30 | Disposition: A | Discharge status: Home / Self Care | Payer: Medicare Other | Source: Ambulatory Visit | Attending: Internal Medicine | Admitting: Internal Medicine

## 2017-05-30 DIAGNOSIS — Z1231 Encounter for screening mammogram for malignant neoplasm of breast: Secondary | ICD-10-CM | POA: Diagnosis not present

## 2017-06-10 DIAGNOSIS — Z23 Encounter for immunization: Secondary | ICD-10-CM | POA: Diagnosis not present

## 2017-06-30 IMAGING — DX DG CHEST 2V
2 series · 2 of 2 positions shown · non-contrast
Comparison: 07/14/2014.

CLINICAL DATA: Numbness down left arm to finger tips. Left chest
pain for 1 day with dizziness.

EXAM:
CHEST  2 VIEW

[chest lat]
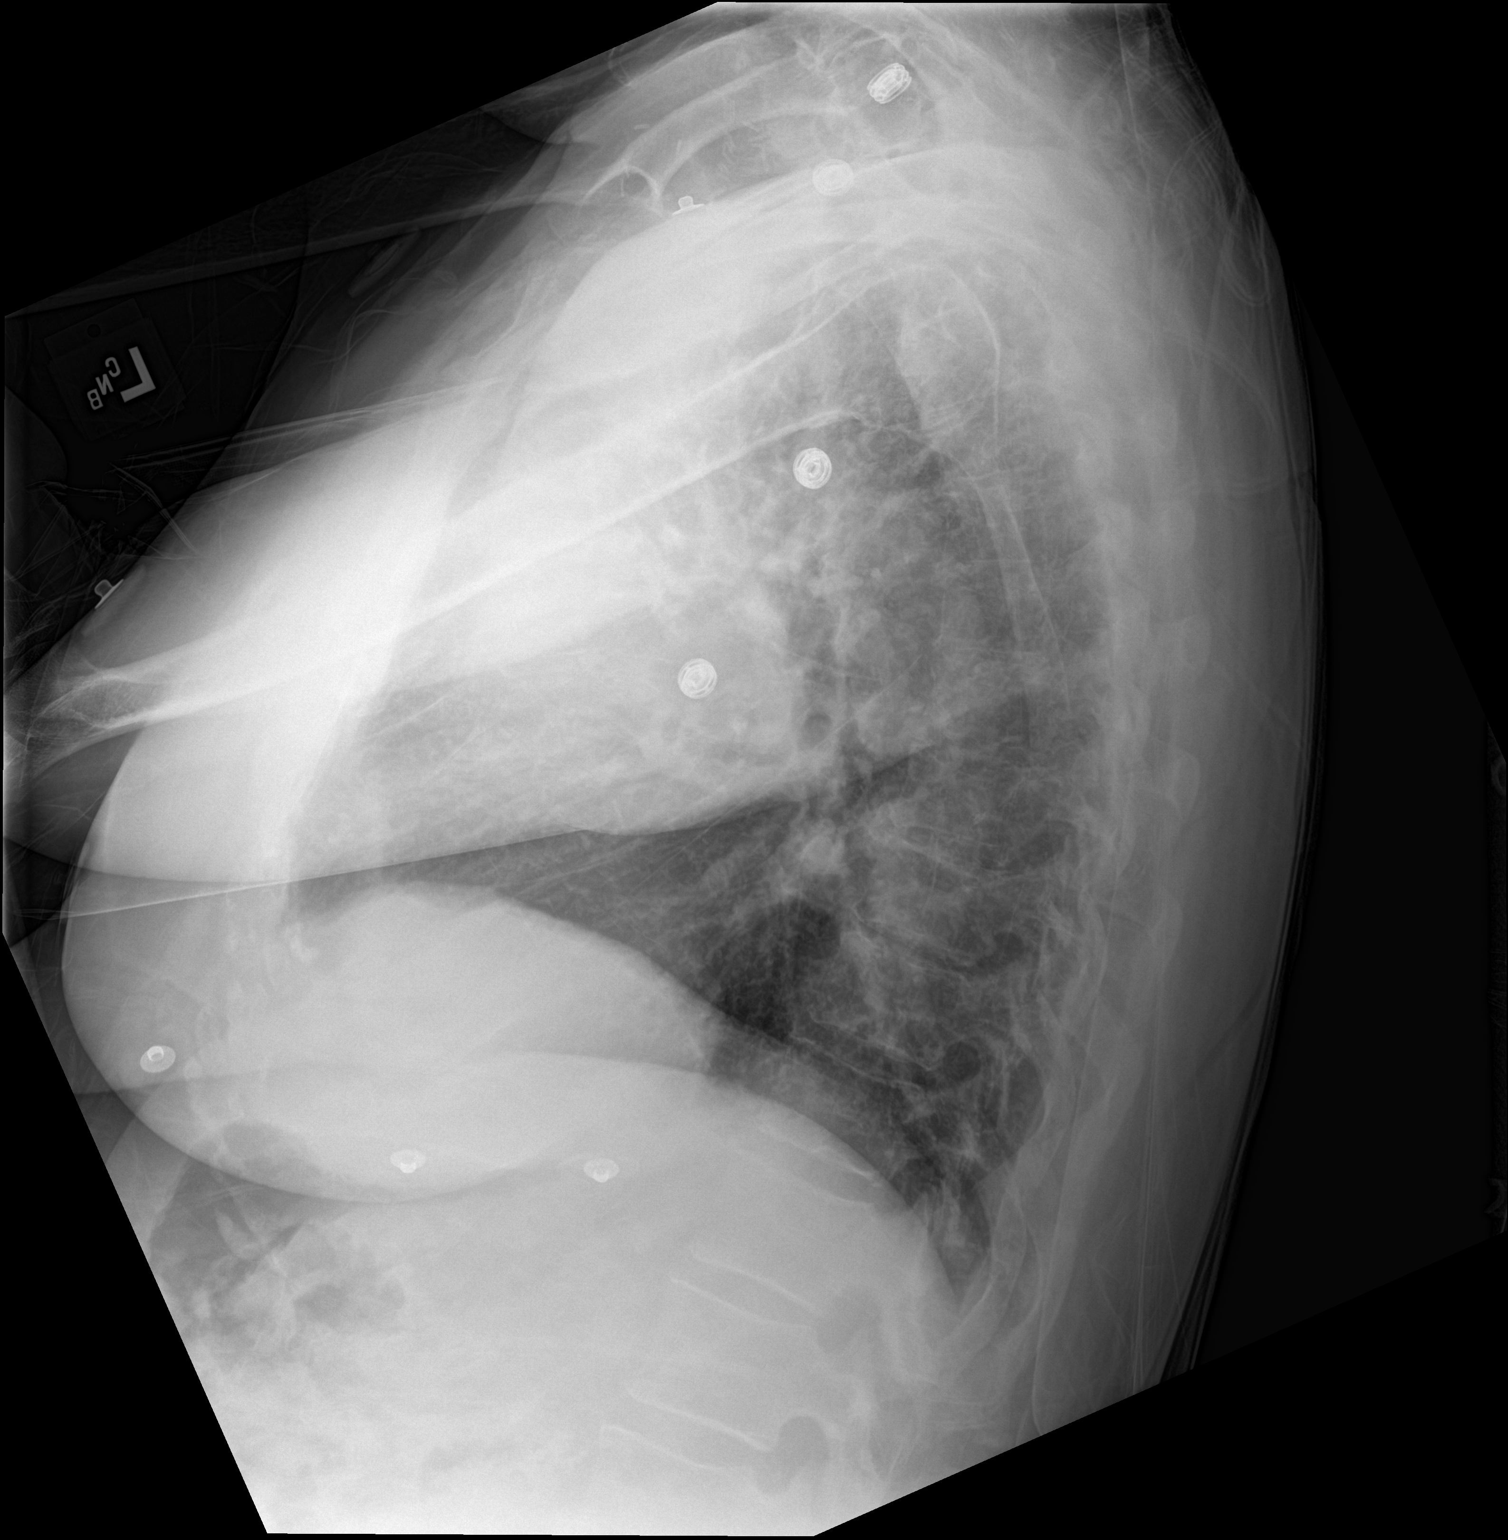

[chest ap]
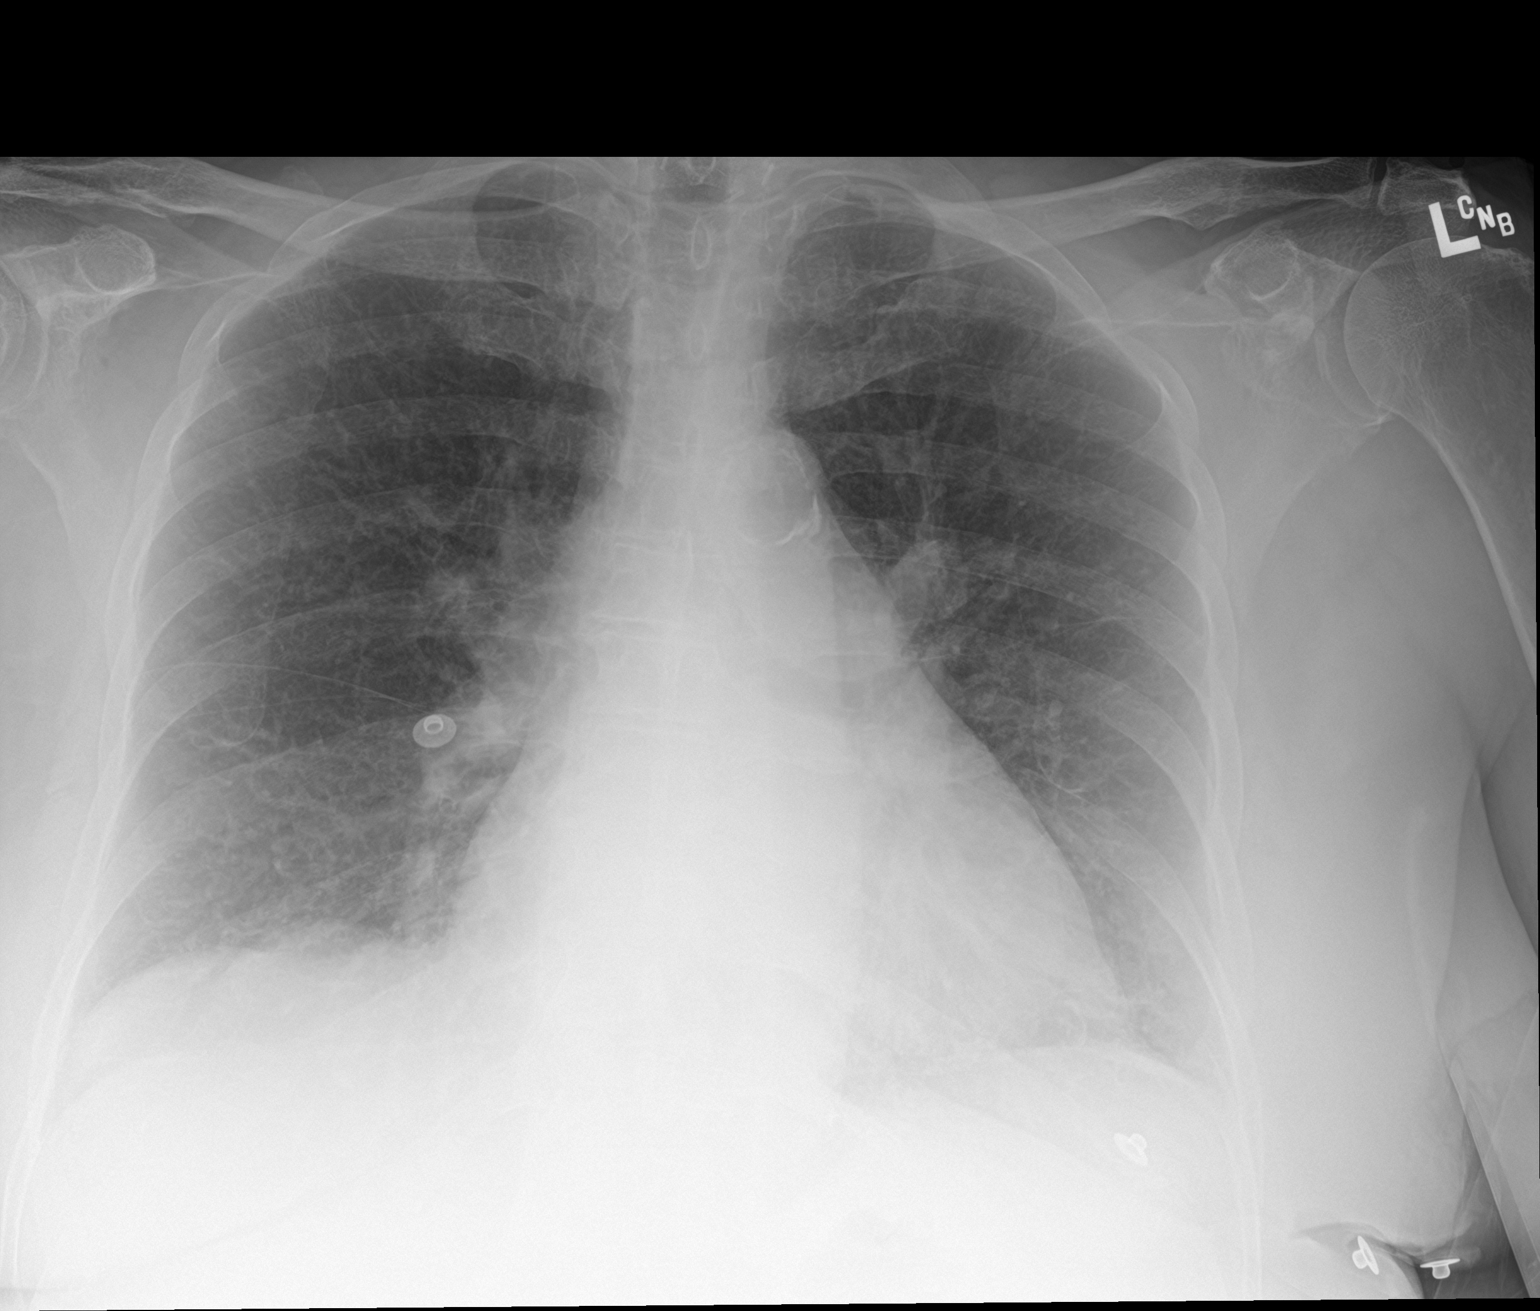

[2 of 2 positions shown; findings below may reference images not displayed]

FINDINGS: Surgical clips are seen in the expected location of the thyroid.
Trachea is midline. Heart is at the upper limits of normal in size.
Thoracic aorta is calcified. Lungs are clear. No pleural fluid.
IMPRESSION: No acute findings.

## 2017-06-30 IMAGING — CT CT HEAD W/O CM
4 series · 17 of 47 positions shown, 19 images · non-contrast
Comparison: CT head dated 04/04/2006.

CLINICAL DATA: Left arm numbness and tingling for 2 days. Recent
chest discomfort. History of hypertension and hyperlipidemia.

EXAM:
CT HEAD WITHOUT CONTRAST
TECHNIQUE: Contiguous axial images were obtained from the base of the skull
through the vertex without intravenous contrast.

[Series 2: head without · axial · non-contrast · 0.40mm/px · z∈[+1185,+1295]mm · 7 of 30 slices shown, 9 images]
[im 4/30  brain]
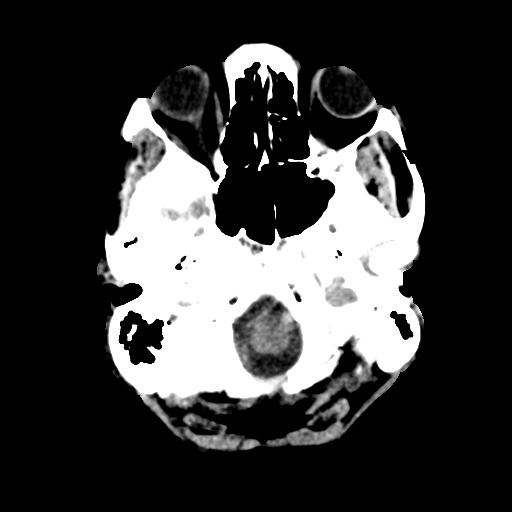
[im 4/30  bone]
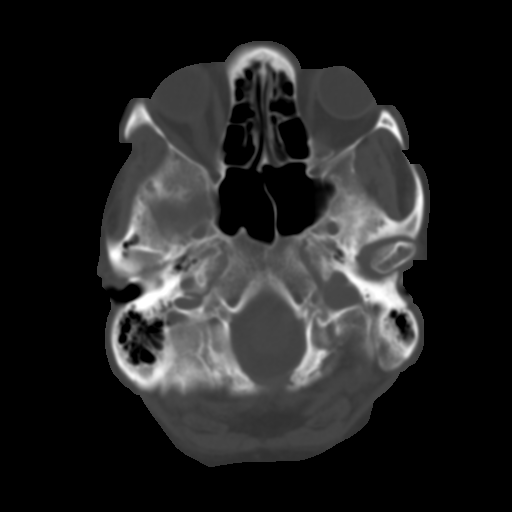
[im 8/30  brain]
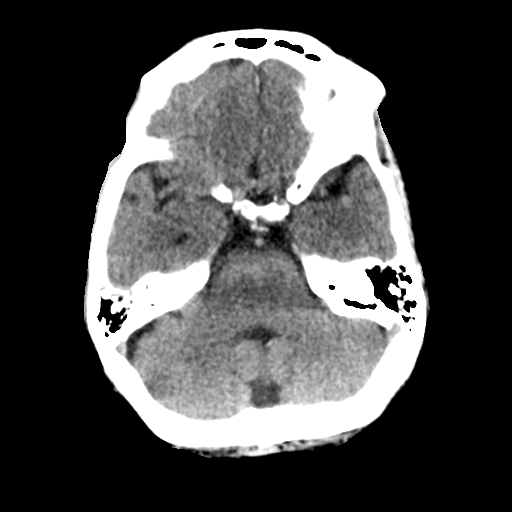
[im 11/30  brain]
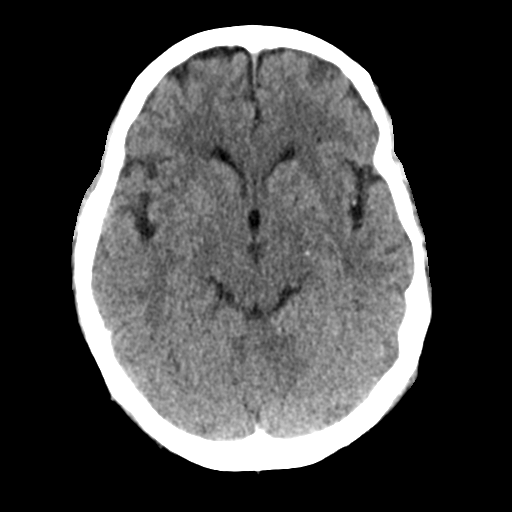
[im 15/30  brain]
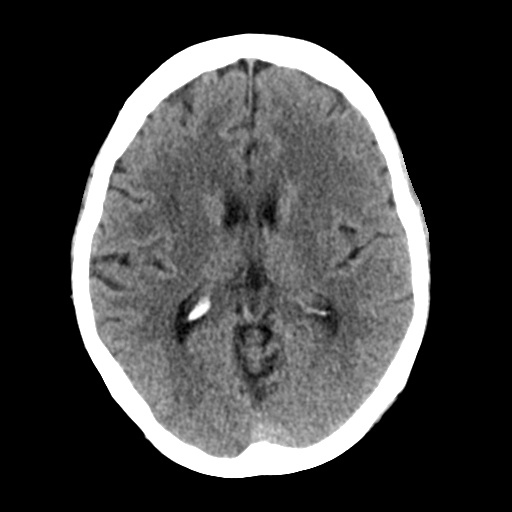
[im 19/30  brain]
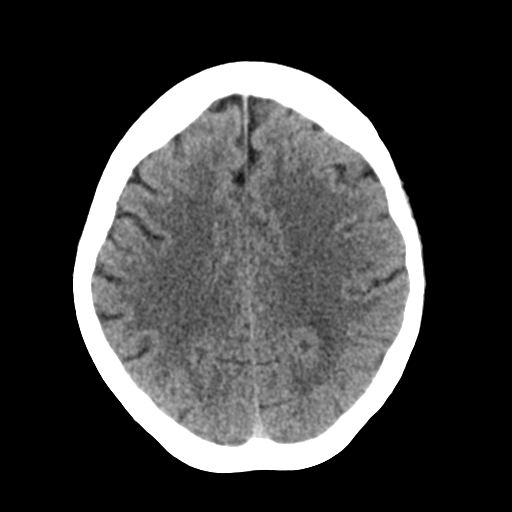
[im 19/30  bone]
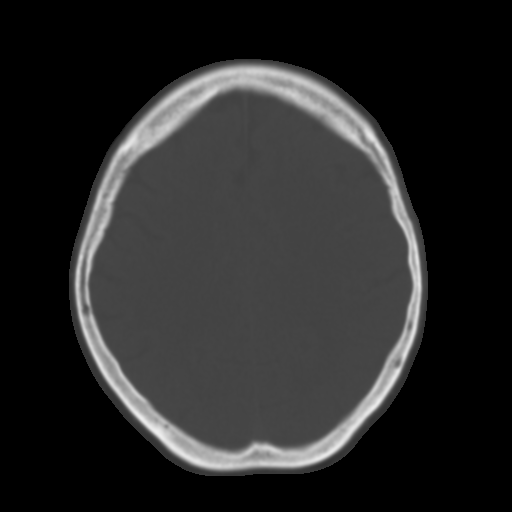
[im 22/30  brain]
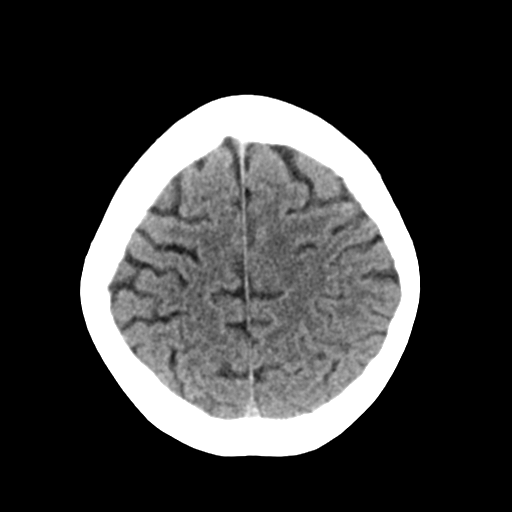
[im 26/30  brain]
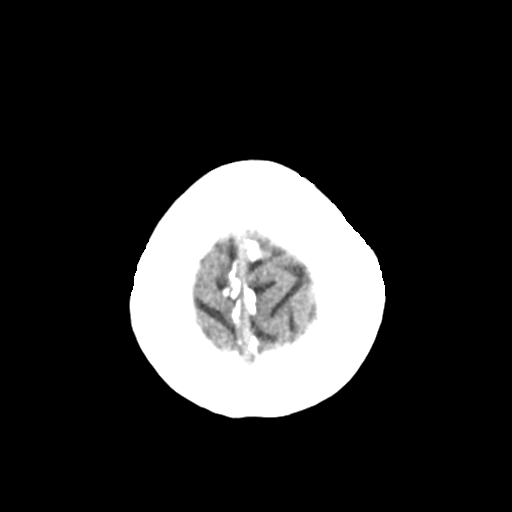

[Series 3: head bone · axial · 0.40mm/px · z∈[+1184,+1234]mm · 4 of 74 slices shown]
[im 8/74  bone]
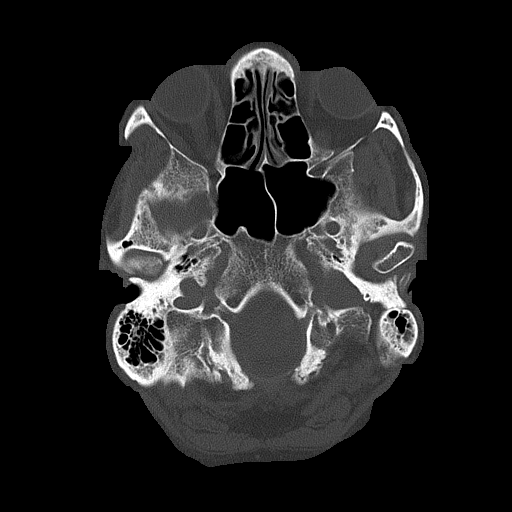
[im 15/74  bone]
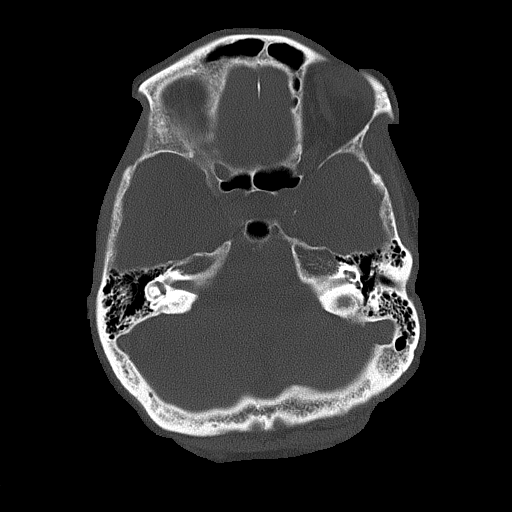
[im 22/74  bone]
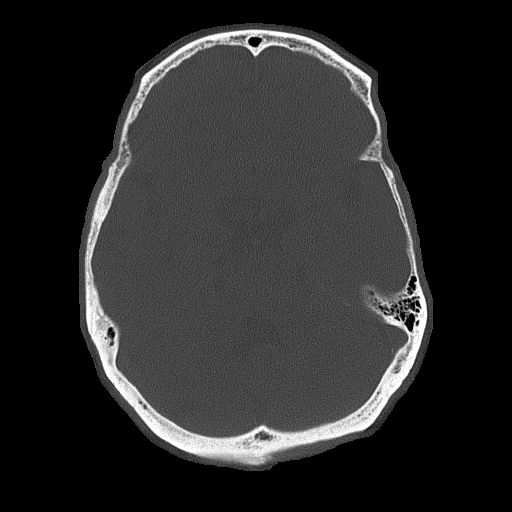
[im 33/74  bone]
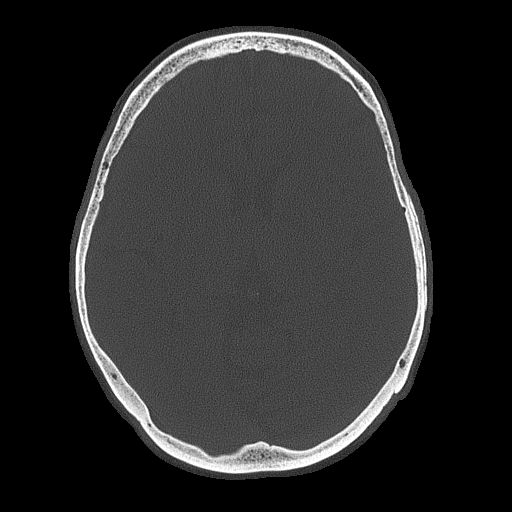

[Series 4: head without cor · coronal · non-contrast · 0.31mm/px · 3 of 62 slices shown]
[im 21/62  brain]
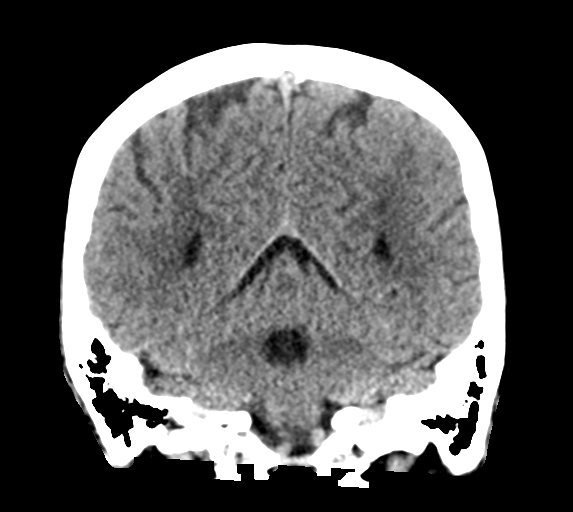
[im 28/62  brain]
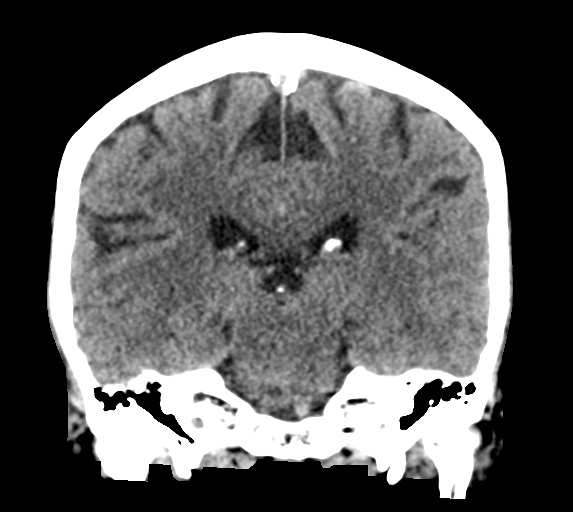
[im 34/62  brain]
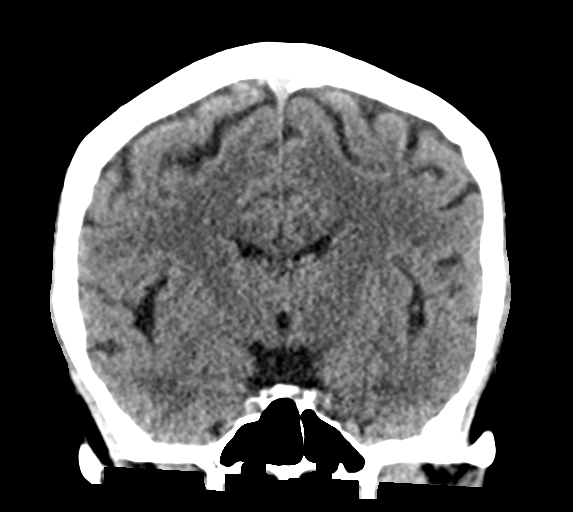

[Series 5: head without sag · sagittal · non-contrast · 0.31mm/px · 3 of 54 slices shown]
[im 18/54  brain]
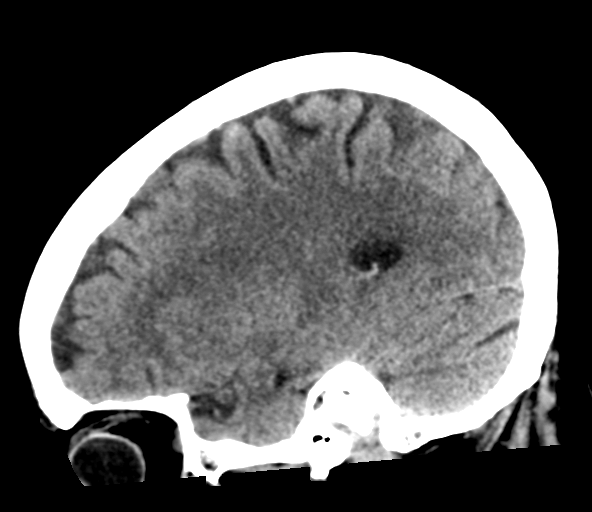
[im 27/54  brain]
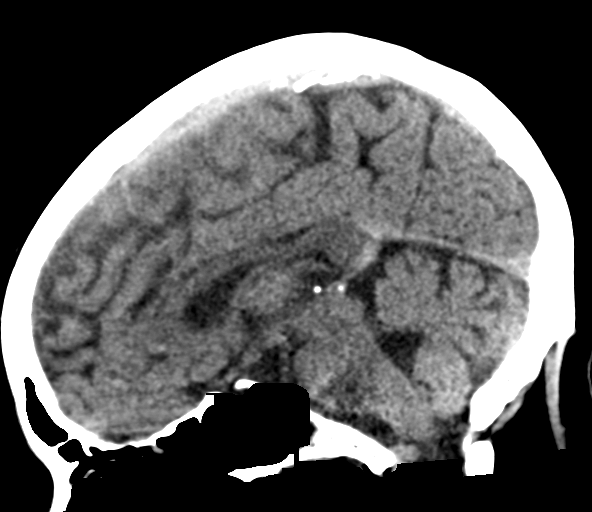
[im 36/54  brain]
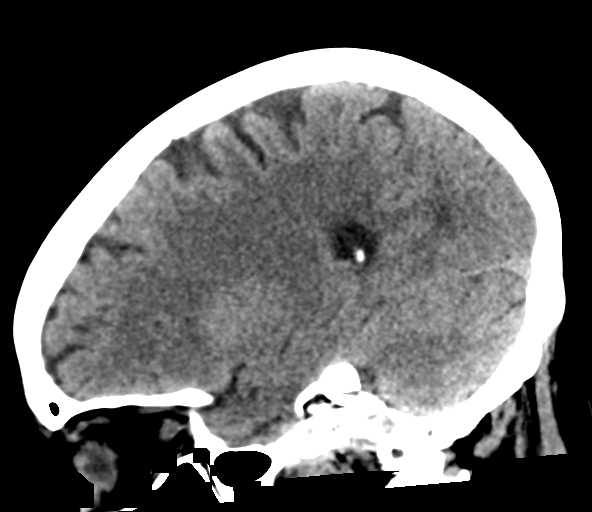

[17 of 47 positions shown; findings below may reference images not displayed]

FINDINGS: Brain: Mild generalized age-related parenchymal atrophy with
commensurate dilatation of the ventricles and sulci. There is no
mass, hemorrhage, edema or other evidence of acute parenchymal
abnormality. No extra-axial hemorrhage.

Vascular: No hyperdense vessel or unexpected calcification. There
are chronic calcified atherosclerotic changes of the large vessels
at the skull base.

Skull: Negative for fracture or focal lesion.

Sinuses/Orbits: No acute findings.

Other: None.
IMPRESSION: Negative head CT.  No intracranial mass, hemorrhage or edema.

## 2017-07-18 DIAGNOSIS — E559 Vitamin D deficiency, unspecified: Secondary | ICD-10-CM | POA: Diagnosis not present

## 2017-10-21 DIAGNOSIS — T148XXA Other injury of unspecified body region, initial encounter: Secondary | ICD-10-CM | POA: Diagnosis not present

## 2017-10-21 DIAGNOSIS — M79651 Pain in right thigh: Secondary | ICD-10-CM | POA: Diagnosis not present

## 2018-04-22 ENCOUNTER — Other Ambulatory Visit: Payer: Self-pay | Admitting: Internal Medicine

## 2018-04-22 DIAGNOSIS — Z1231 Encounter for screening mammogram for malignant neoplasm of breast: Secondary | ICD-10-CM

## 2018-06-04 ENCOUNTER — Ambulatory Visit
Admission: RE | Admit: 2018-06-04 | Discharge: 2018-06-04 | Disposition: A | Discharge status: Home / Self Care | Payer: Medicare Other | Source: Ambulatory Visit | Attending: Internal Medicine | Admitting: Internal Medicine

## 2018-06-04 DIAGNOSIS — Z1231 Encounter for screening mammogram for malignant neoplasm of breast: Secondary | ICD-10-CM

## 2018-07-01 DIAGNOSIS — Z23 Encounter for immunization: Secondary | ICD-10-CM | POA: Diagnosis not present

## 2018-12-14 DIAGNOSIS — R7301 Impaired fasting glucose: Secondary | ICD-10-CM | POA: Diagnosis not present

## 2018-12-14 DIAGNOSIS — F439 Reaction to severe stress, unspecified: Secondary | ICD-10-CM | POA: Diagnosis not present

## 2018-12-14 DIAGNOSIS — I10 Essential (primary) hypertension: Secondary | ICD-10-CM | POA: Diagnosis not present

## 2018-12-14 DIAGNOSIS — Z5320 Procedure and treatment not carried out because of patient's decision for unspecified reasons: Secondary | ICD-10-CM | POA: Diagnosis not present

## 2018-12-14 DIAGNOSIS — E785 Hyperlipidemia, unspecified: Secondary | ICD-10-CM | POA: Diagnosis not present

## 2018-12-14 DIAGNOSIS — E559 Vitamin D deficiency, unspecified: Secondary | ICD-10-CM | POA: Diagnosis not present

## 2019-04-26 ENCOUNTER — Other Ambulatory Visit: Payer: Self-pay | Admitting: Internal Medicine

## 2019-04-26 DIAGNOSIS — Z1231 Encounter for screening mammogram for malignant neoplasm of breast: Secondary | ICD-10-CM

## 2019-05-11 DIAGNOSIS — Z23 Encounter for immunization: Secondary | ICD-10-CM | POA: Diagnosis not present

## 2019-06-11 ENCOUNTER — Other Ambulatory Visit: Payer: Self-pay

## 2019-06-11 ENCOUNTER — Ambulatory Visit
Admission: RE | Admit: 2019-06-11 | Discharge: 2019-06-11 | Disposition: A | Discharge status: Home / Self Care | Payer: Medicare Other | Source: Ambulatory Visit | Attending: Internal Medicine | Admitting: Internal Medicine

## 2019-06-11 DIAGNOSIS — Z1231 Encounter for screening mammogram for malignant neoplasm of breast: Secondary | ICD-10-CM | POA: Diagnosis not present

## 2019-06-14 ENCOUNTER — Other Ambulatory Visit: Payer: Self-pay | Admitting: Internal Medicine

## 2019-06-14 DIAGNOSIS — R928 Other abnormal and inconclusive findings on diagnostic imaging of breast: Secondary | ICD-10-CM

## 2019-06-16 ENCOUNTER — Ambulatory Visit
Admission: RE | Admit: 2019-06-16 | Discharge: 2019-06-16 | Disposition: A | Discharge status: Home / Self Care | Payer: Medicare Other | Source: Ambulatory Visit | Attending: Internal Medicine | Admitting: Internal Medicine

## 2019-06-16 ENCOUNTER — Other Ambulatory Visit: Payer: Self-pay

## 2019-06-16 ENCOUNTER — Ambulatory Visit: Payer: Medicare Other

## 2019-06-16 DIAGNOSIS — R928 Other abnormal and inconclusive findings on diagnostic imaging of breast: Secondary | ICD-10-CM

## 2019-08-23 DIAGNOSIS — I10 Essential (primary) hypertension: Secondary | ICD-10-CM | POA: Diagnosis not present

## 2019-08-23 DIAGNOSIS — R21 Rash and other nonspecific skin eruption: Secondary | ICD-10-CM | POA: Diagnosis not present

## 2020-02-14 DIAGNOSIS — M19012 Primary osteoarthritis, left shoulder: Secondary | ICD-10-CM | POA: Diagnosis not present

## 2020-02-14 DIAGNOSIS — R229 Localized swelling, mass and lump, unspecified: Secondary | ICD-10-CM | POA: Diagnosis not present

## 2020-02-14 DIAGNOSIS — M79602 Pain in left arm: Secondary | ICD-10-CM | POA: Diagnosis not present

## 2020-02-14 DIAGNOSIS — D179 Benign lipomatous neoplasm, unspecified: Secondary | ICD-10-CM | POA: Diagnosis not present

## 2020-02-24 DIAGNOSIS — M79602 Pain in left arm: Secondary | ICD-10-CM | POA: Diagnosis not present

## 2020-02-29 ENCOUNTER — Other Ambulatory Visit: Payer: Self-pay | Admitting: Family Medicine

## 2020-02-29 DIAGNOSIS — R609 Edema, unspecified: Secondary | ICD-10-CM

## 2020-03-14 ENCOUNTER — Ambulatory Visit
Admission: RE | Admit: 2020-03-14 | Discharge: 2020-03-14 | Disposition: A | Discharge status: Home / Self Care | Payer: Medicare Other | Source: Ambulatory Visit | Attending: Family Medicine | Admitting: Family Medicine

## 2020-03-14 ENCOUNTER — Other Ambulatory Visit: Payer: Self-pay

## 2020-03-14 DIAGNOSIS — M7989 Other specified soft tissue disorders: Secondary | ICD-10-CM | POA: Diagnosis not present

## 2020-03-14 DIAGNOSIS — R609 Edema, unspecified: Secondary | ICD-10-CM

## 2020-05-08 ENCOUNTER — Other Ambulatory Visit: Payer: Self-pay | Admitting: Family Medicine

## 2020-05-08 DIAGNOSIS — Z1231 Encounter for screening mammogram for malignant neoplasm of breast: Secondary | ICD-10-CM

## 2020-06-01 DIAGNOSIS — M7632 Iliotibial band syndrome, left leg: Secondary | ICD-10-CM | POA: Diagnosis not present

## 2020-06-01 DIAGNOSIS — Z23 Encounter for immunization: Secondary | ICD-10-CM | POA: Diagnosis not present

## 2020-06-12 ENCOUNTER — Ambulatory Visit
Admission: RE | Admit: 2020-06-12 | Discharge: 2020-06-12 | Disposition: A | Discharge status: Home / Self Care | Payer: Medicare Other | Source: Ambulatory Visit | Attending: Family Medicine | Admitting: Family Medicine

## 2020-06-12 ENCOUNTER — Other Ambulatory Visit: Payer: Self-pay

## 2020-06-12 DIAGNOSIS — Z1231 Encounter for screening mammogram for malignant neoplasm of breast: Secondary | ICD-10-CM

## 2020-06-23 DIAGNOSIS — Z23 Encounter for immunization: Secondary | ICD-10-CM | POA: Diagnosis not present

## 2020-07-10 DIAGNOSIS — I10 Essential (primary) hypertension: Secondary | ICD-10-CM | POA: Diagnosis not present

## 2020-07-10 DIAGNOSIS — E559 Vitamin D deficiency, unspecified: Secondary | ICD-10-CM | POA: Diagnosis not present

## 2020-07-18 DIAGNOSIS — T148XXA Other injury of unspecified body region, initial encounter: Secondary | ICD-10-CM | POA: Diagnosis not present

## 2020-07-18 DIAGNOSIS — M1990 Unspecified osteoarthritis, unspecified site: Secondary | ICD-10-CM | POA: Diagnosis not present

## 2020-07-18 DIAGNOSIS — E559 Vitamin D deficiency, unspecified: Secondary | ICD-10-CM | POA: Diagnosis not present

## 2020-07-18 DIAGNOSIS — Z0000 Encounter for general adult medical examination without abnormal findings: Secondary | ICD-10-CM | POA: Diagnosis not present

## 2020-07-18 DIAGNOSIS — I10 Essential (primary) hypertension: Secondary | ICD-10-CM | POA: Diagnosis not present

## 2020-07-18 DIAGNOSIS — E785 Hyperlipidemia, unspecified: Secondary | ICD-10-CM | POA: Diagnosis not present

## 2020-08-02 DIAGNOSIS — M25571 Pain in right ankle and joints of right foot: Secondary | ICD-10-CM | POA: Diagnosis not present

## 2020-08-02 DIAGNOSIS — S81801A Unspecified open wound, right lower leg, initial encounter: Secondary | ICD-10-CM | POA: Diagnosis not present

## 2020-08-02 DIAGNOSIS — M7989 Other specified soft tissue disorders: Secondary | ICD-10-CM | POA: Diagnosis not present

## 2020-08-02 DIAGNOSIS — M25572 Pain in left ankle and joints of left foot: Secondary | ICD-10-CM | POA: Diagnosis not present

## 2020-08-04 DIAGNOSIS — S81801A Unspecified open wound, right lower leg, initial encounter: Secondary | ICD-10-CM | POA: Diagnosis not present

## 2020-08-07 DIAGNOSIS — S81801A Unspecified open wound, right lower leg, initial encounter: Secondary | ICD-10-CM | POA: Diagnosis not present

## 2020-08-10 DIAGNOSIS — M79605 Pain in left leg: Secondary | ICD-10-CM | POA: Diagnosis not present

## 2020-08-10 DIAGNOSIS — S81801A Unspecified open wound, right lower leg, initial encounter: Secondary | ICD-10-CM | POA: Diagnosis not present

## 2020-12-27 DIAGNOSIS — Z23 Encounter for immunization: Secondary | ICD-10-CM | POA: Diagnosis not present

## 2021-01-09 DIAGNOSIS — N390 Urinary tract infection, site not specified: Secondary | ICD-10-CM | POA: Diagnosis not present

## 2021-01-09 DIAGNOSIS — E785 Hyperlipidemia, unspecified: Secondary | ICD-10-CM | POA: Diagnosis not present

## 2021-01-09 DIAGNOSIS — E559 Vitamin D deficiency, unspecified: Secondary | ICD-10-CM | POA: Diagnosis not present

## 2021-01-09 DIAGNOSIS — E7800 Pure hypercholesterolemia, unspecified: Secondary | ICD-10-CM | POA: Diagnosis not present

## 2021-01-09 DIAGNOSIS — I10 Essential (primary) hypertension: Secondary | ICD-10-CM | POA: Diagnosis not present

## 2021-01-09 DIAGNOSIS — R7309 Other abnormal glucose: Secondary | ICD-10-CM | POA: Diagnosis not present

## 2021-01-09 DIAGNOSIS — R946 Abnormal results of thyroid function studies: Secondary | ICD-10-CM | POA: Diagnosis not present

## 2021-01-16 DIAGNOSIS — M1990 Unspecified osteoarthritis, unspecified site: Secondary | ICD-10-CM | POA: Diagnosis not present

## 2021-01-16 DIAGNOSIS — I10 Essential (primary) hypertension: Secondary | ICD-10-CM | POA: Diagnosis not present

## 2021-01-16 DIAGNOSIS — F419 Anxiety disorder, unspecified: Secondary | ICD-10-CM | POA: Diagnosis not present

## 2021-01-16 DIAGNOSIS — J3089 Other allergic rhinitis: Secondary | ICD-10-CM | POA: Diagnosis not present

## 2021-01-16 DIAGNOSIS — E559 Vitamin D deficiency, unspecified: Secondary | ICD-10-CM | POA: Diagnosis not present

## 2021-01-16 DIAGNOSIS — E785 Hyperlipidemia, unspecified: Secondary | ICD-10-CM | POA: Diagnosis not present

## 2021-01-16 DIAGNOSIS — N390 Urinary tract infection, site not specified: Secondary | ICD-10-CM | POA: Diagnosis not present

## 2021-01-16 DIAGNOSIS — Z79899 Other long term (current) drug therapy: Secondary | ICD-10-CM | POA: Diagnosis not present

## 2021-05-10 DIAGNOSIS — R350 Frequency of micturition: Secondary | ICD-10-CM | POA: Diagnosis not present

## 2021-05-10 DIAGNOSIS — M25512 Pain in left shoulder: Secondary | ICD-10-CM | POA: Diagnosis not present

## 2021-05-16 DIAGNOSIS — M7502 Adhesive capsulitis of left shoulder: Secondary | ICD-10-CM | POA: Diagnosis not present

## 2021-05-17 ENCOUNTER — Other Ambulatory Visit: Payer: Self-pay | Admitting: Family Medicine

## 2021-05-17 DIAGNOSIS — Z1231 Encounter for screening mammogram for malignant neoplasm of breast: Secondary | ICD-10-CM

## 2021-06-04 DIAGNOSIS — Z23 Encounter for immunization: Secondary | ICD-10-CM | POA: Diagnosis not present

## 2021-06-15 DIAGNOSIS — M7502 Adhesive capsulitis of left shoulder: Secondary | ICD-10-CM | POA: Diagnosis not present

## 2021-06-18 ENCOUNTER — Other Ambulatory Visit: Payer: Self-pay | Admitting: Orthopedic Surgery

## 2021-06-18 DIAGNOSIS — M25512 Pain in left shoulder: Secondary | ICD-10-CM

## 2021-06-19 ENCOUNTER — Ambulatory Visit
Admission: RE | Admit: 2021-06-19 | Discharge: 2021-06-19 | Disposition: A | Discharge status: Home / Self Care | Payer: Medicare Other | Source: Ambulatory Visit | Attending: Orthopedic Surgery | Admitting: Orthopedic Surgery

## 2021-06-19 DIAGNOSIS — M19012 Primary osteoarthritis, left shoulder: Secondary | ICD-10-CM | POA: Diagnosis not present

## 2021-06-19 DIAGNOSIS — M7552 Bursitis of left shoulder: Secondary | ICD-10-CM | POA: Diagnosis not present

## 2021-06-19 DIAGNOSIS — M75112 Incomplete rotator cuff tear or rupture of left shoulder, not specified as traumatic: Secondary | ICD-10-CM | POA: Diagnosis not present

## 2021-06-19 DIAGNOSIS — M25412 Effusion, left shoulder: Secondary | ICD-10-CM | POA: Diagnosis not present

## 2021-06-19 DIAGNOSIS — M25512 Pain in left shoulder: Secondary | ICD-10-CM

## 2021-06-22 ENCOUNTER — Other Ambulatory Visit: Payer: Medicare Other

## 2021-06-28 ENCOUNTER — Ambulatory Visit
Admission: RE | Admit: 2021-06-28 | Discharge: 2021-06-28 | Disposition: A | Discharge status: Home / Self Care | Payer: Medicare Other | Source: Ambulatory Visit | Attending: Family Medicine | Admitting: Family Medicine

## 2021-06-28 ENCOUNTER — Other Ambulatory Visit: Payer: Self-pay

## 2021-06-28 DIAGNOSIS — Z1231 Encounter for screening mammogram for malignant neoplasm of breast: Secondary | ICD-10-CM | POA: Diagnosis not present

## 2021-06-29 DIAGNOSIS — M7502 Adhesive capsulitis of left shoulder: Secondary | ICD-10-CM | POA: Diagnosis not present

## 2021-07-16 DIAGNOSIS — N390 Urinary tract infection, site not specified: Secondary | ICD-10-CM | POA: Diagnosis not present

## 2021-07-16 DIAGNOSIS — Z79899 Other long term (current) drug therapy: Secondary | ICD-10-CM | POA: Diagnosis not present

## 2021-07-16 DIAGNOSIS — E559 Vitamin D deficiency, unspecified: Secondary | ICD-10-CM | POA: Diagnosis not present

## 2021-07-16 DIAGNOSIS — E785 Hyperlipidemia, unspecified: Secondary | ICD-10-CM | POA: Diagnosis not present

## 2021-07-17 DIAGNOSIS — M659 Synovitis and tenosynovitis, unspecified: Secondary | ICD-10-CM | POA: Diagnosis not present

## 2021-07-17 DIAGNOSIS — M24112 Other articular cartilage disorders, left shoulder: Secondary | ICD-10-CM | POA: Diagnosis not present

## 2021-07-17 DIAGNOSIS — M75112 Incomplete rotator cuff tear or rupture of left shoulder, not specified as traumatic: Secondary | ICD-10-CM | POA: Diagnosis not present

## 2021-07-17 DIAGNOSIS — G8918 Other acute postprocedural pain: Secondary | ICD-10-CM | POA: Diagnosis not present

## 2021-07-17 DIAGNOSIS — M7542 Impingement syndrome of left shoulder: Secondary | ICD-10-CM | POA: Diagnosis not present

## 2021-07-17 DIAGNOSIS — M7552 Bursitis of left shoulder: Secondary | ICD-10-CM | POA: Diagnosis not present

## 2021-07-17 DIAGNOSIS — M94212 Chondromalacia, left shoulder: Secondary | ICD-10-CM | POA: Diagnosis not present

## 2021-07-17 DIAGNOSIS — M7502 Adhesive capsulitis of left shoulder: Secondary | ICD-10-CM | POA: Diagnosis not present

## 2021-07-23 DIAGNOSIS — Z1211 Encounter for screening for malignant neoplasm of colon: Secondary | ICD-10-CM | POA: Diagnosis not present

## 2021-07-23 DIAGNOSIS — N390 Urinary tract infection, site not specified: Secondary | ICD-10-CM | POA: Diagnosis not present

## 2021-07-23 DIAGNOSIS — Z9889 Other specified postprocedural states: Secondary | ICD-10-CM | POA: Diagnosis not present

## 2021-07-23 DIAGNOSIS — M7502 Adhesive capsulitis of left shoulder: Secondary | ICD-10-CM | POA: Diagnosis not present

## 2021-07-23 DIAGNOSIS — M1990 Unspecified osteoarthritis, unspecified site: Secondary | ICD-10-CM | POA: Diagnosis not present

## 2021-07-23 DIAGNOSIS — R195 Other fecal abnormalities: Secondary | ICD-10-CM | POA: Diagnosis not present

## 2021-07-23 DIAGNOSIS — Z23 Encounter for immunization: Secondary | ICD-10-CM | POA: Diagnosis not present

## 2021-07-23 DIAGNOSIS — E559 Vitamin D deficiency, unspecified: Secondary | ICD-10-CM | POA: Diagnosis not present

## 2021-07-23 DIAGNOSIS — M25512 Pain in left shoulder: Secondary | ICD-10-CM | POA: Diagnosis not present

## 2021-07-23 DIAGNOSIS — E785 Hyperlipidemia, unspecified: Secondary | ICD-10-CM | POA: Diagnosis not present

## 2021-07-23 DIAGNOSIS — R7309 Other abnormal glucose: Secondary | ICD-10-CM | POA: Diagnosis not present

## 2021-07-23 DIAGNOSIS — F419 Anxiety disorder, unspecified: Secondary | ICD-10-CM | POA: Diagnosis not present

## 2021-07-23 DIAGNOSIS — I10 Essential (primary) hypertension: Secondary | ICD-10-CM | POA: Diagnosis not present

## 2021-07-23 DIAGNOSIS — R531 Weakness: Secondary | ICD-10-CM | POA: Diagnosis not present

## 2021-07-25 DIAGNOSIS — M7502 Adhesive capsulitis of left shoulder: Secondary | ICD-10-CM | POA: Diagnosis not present

## 2021-07-25 DIAGNOSIS — M25512 Pain in left shoulder: Secondary | ICD-10-CM | POA: Diagnosis not present

## 2021-07-25 DIAGNOSIS — R531 Weakness: Secondary | ICD-10-CM | POA: Diagnosis not present

## 2021-07-25 DIAGNOSIS — Z9889 Other specified postprocedural states: Secondary | ICD-10-CM | POA: Diagnosis not present

## 2021-07-27 DIAGNOSIS — M7502 Adhesive capsulitis of left shoulder: Secondary | ICD-10-CM | POA: Diagnosis not present

## 2021-07-27 DIAGNOSIS — R531 Weakness: Secondary | ICD-10-CM | POA: Diagnosis not present

## 2021-07-27 DIAGNOSIS — Z9889 Other specified postprocedural states: Secondary | ICD-10-CM | POA: Diagnosis not present

## 2021-07-27 DIAGNOSIS — M25512 Pain in left shoulder: Secondary | ICD-10-CM | POA: Diagnosis not present

## 2021-07-30 DIAGNOSIS — R531 Weakness: Secondary | ICD-10-CM | POA: Diagnosis not present

## 2021-07-30 DIAGNOSIS — M25512 Pain in left shoulder: Secondary | ICD-10-CM | POA: Diagnosis not present

## 2021-07-30 DIAGNOSIS — Z9889 Other specified postprocedural states: Secondary | ICD-10-CM | POA: Diagnosis not present

## 2021-07-30 DIAGNOSIS — M7502 Adhesive capsulitis of left shoulder: Secondary | ICD-10-CM | POA: Diagnosis not present

## 2021-08-01 DIAGNOSIS — M7502 Adhesive capsulitis of left shoulder: Secondary | ICD-10-CM | POA: Diagnosis not present

## 2021-08-01 DIAGNOSIS — Z9889 Other specified postprocedural states: Secondary | ICD-10-CM | POA: Diagnosis not present

## 2021-08-01 DIAGNOSIS — R531 Weakness: Secondary | ICD-10-CM | POA: Diagnosis not present

## 2021-08-01 DIAGNOSIS — M25512 Pain in left shoulder: Secondary | ICD-10-CM | POA: Diagnosis not present

## 2021-08-06 DIAGNOSIS — M25512 Pain in left shoulder: Secondary | ICD-10-CM | POA: Diagnosis not present

## 2021-08-06 DIAGNOSIS — R531 Weakness: Secondary | ICD-10-CM | POA: Diagnosis not present

## 2021-08-06 DIAGNOSIS — M7502 Adhesive capsulitis of left shoulder: Secondary | ICD-10-CM | POA: Diagnosis not present

## 2021-08-06 DIAGNOSIS — Z9889 Other specified postprocedural states: Secondary | ICD-10-CM | POA: Diagnosis not present

## 2021-08-08 DIAGNOSIS — M25512 Pain in left shoulder: Secondary | ICD-10-CM | POA: Diagnosis not present

## 2021-08-08 DIAGNOSIS — R531 Weakness: Secondary | ICD-10-CM | POA: Diagnosis not present

## 2021-08-08 DIAGNOSIS — Z9889 Other specified postprocedural states: Secondary | ICD-10-CM | POA: Diagnosis not present

## 2021-08-08 DIAGNOSIS — M7502 Adhesive capsulitis of left shoulder: Secondary | ICD-10-CM | POA: Diagnosis not present

## 2021-08-13 DIAGNOSIS — R531 Weakness: Secondary | ICD-10-CM | POA: Diagnosis not present

## 2021-08-13 DIAGNOSIS — M7502 Adhesive capsulitis of left shoulder: Secondary | ICD-10-CM | POA: Diagnosis not present

## 2021-08-13 DIAGNOSIS — Z9889 Other specified postprocedural states: Secondary | ICD-10-CM | POA: Diagnosis not present

## 2021-08-13 DIAGNOSIS — M25512 Pain in left shoulder: Secondary | ICD-10-CM | POA: Diagnosis not present

## 2021-08-14 DIAGNOSIS — Z9889 Other specified postprocedural states: Secondary | ICD-10-CM | POA: Diagnosis not present

## 2021-08-14 DIAGNOSIS — R531 Weakness: Secondary | ICD-10-CM | POA: Diagnosis not present

## 2021-08-14 DIAGNOSIS — M25512 Pain in left shoulder: Secondary | ICD-10-CM | POA: Diagnosis not present

## 2021-08-14 DIAGNOSIS — M7502 Adhesive capsulitis of left shoulder: Secondary | ICD-10-CM | POA: Diagnosis not present

## 2021-08-22 DIAGNOSIS — R531 Weakness: Secondary | ICD-10-CM | POA: Diagnosis not present

## 2021-08-22 DIAGNOSIS — Z9889 Other specified postprocedural states: Secondary | ICD-10-CM | POA: Diagnosis not present

## 2021-08-22 DIAGNOSIS — M25512 Pain in left shoulder: Secondary | ICD-10-CM | POA: Diagnosis not present

## 2021-08-22 DIAGNOSIS — M7502 Adhesive capsulitis of left shoulder: Secondary | ICD-10-CM | POA: Diagnosis not present

## 2021-08-24 DIAGNOSIS — Z9889 Other specified postprocedural states: Secondary | ICD-10-CM | POA: Diagnosis not present

## 2021-08-24 DIAGNOSIS — R531 Weakness: Secondary | ICD-10-CM | POA: Diagnosis not present

## 2021-08-24 DIAGNOSIS — M7502 Adhesive capsulitis of left shoulder: Secondary | ICD-10-CM | POA: Diagnosis not present

## 2021-08-24 DIAGNOSIS — M25512 Pain in left shoulder: Secondary | ICD-10-CM | POA: Diagnosis not present

## 2021-08-27 DIAGNOSIS — Z9889 Other specified postprocedural states: Secondary | ICD-10-CM | POA: Diagnosis not present

## 2021-08-27 DIAGNOSIS — R531 Weakness: Secondary | ICD-10-CM | POA: Diagnosis not present

## 2021-08-27 DIAGNOSIS — M7502 Adhesive capsulitis of left shoulder: Secondary | ICD-10-CM | POA: Diagnosis not present

## 2021-08-27 DIAGNOSIS — M25512 Pain in left shoulder: Secondary | ICD-10-CM | POA: Diagnosis not present

## 2021-08-29 DIAGNOSIS — S42201A Unspecified fracture of upper end of right humerus, initial encounter for closed fracture: Secondary | ICD-10-CM | POA: Diagnosis not present

## 2021-08-29 DIAGNOSIS — M25511 Pain in right shoulder: Secondary | ICD-10-CM | POA: Diagnosis not present

## 2021-09-12 DIAGNOSIS — M25511 Pain in right shoulder: Secondary | ICD-10-CM | POA: Diagnosis not present

## 2021-09-12 DIAGNOSIS — Z9889 Other specified postprocedural states: Secondary | ICD-10-CM | POA: Diagnosis not present

## 2022-06-17 ENCOUNTER — Other Ambulatory Visit: Payer: Self-pay | Admitting: Internal Medicine

## 2022-06-17 ENCOUNTER — Other Ambulatory Visit: Payer: Self-pay | Admitting: Family Medicine

## 2022-06-17 DIAGNOSIS — Z1231 Encounter for screening mammogram for malignant neoplasm of breast: Secondary | ICD-10-CM

## 2022-07-05 ENCOUNTER — Ambulatory Visit
Admission: RE | Admit: 2022-07-05 | Discharge: 2022-07-05 | Disposition: A | Discharge status: Home / Self Care | Payer: Medicare (Managed Care) | Source: Ambulatory Visit | Attending: Internal Medicine | Admitting: Internal Medicine

## 2022-07-05 DIAGNOSIS — Z1231 Encounter for screening mammogram for malignant neoplasm of breast: Secondary | ICD-10-CM

## 2023-09-02 ENCOUNTER — Other Ambulatory Visit: Payer: Self-pay | Admitting: Family Medicine

## 2023-09-02 DIAGNOSIS — Z0000 Encounter for general adult medical examination without abnormal findings: Secondary | ICD-10-CM

## 2023-09-26 ENCOUNTER — Ambulatory Visit
Admission: RE | Admit: 2023-09-26 | Discharge: 2023-09-26 | Disposition: A | Discharge status: Home / Self Care | Payer: Medicare (Managed Care) | Source: Ambulatory Visit | Attending: Family Medicine | Admitting: Family Medicine

## 2023-09-26 DIAGNOSIS — Z0000 Encounter for general adult medical examination without abnormal findings: Secondary | ICD-10-CM

## 2024-01-27 ENCOUNTER — Other Ambulatory Visit: Payer: Self-pay | Admitting: Family Medicine

## 2024-01-27 DIAGNOSIS — N644 Mastodynia: Secondary | ICD-10-CM

## 2024-01-29 ENCOUNTER — Ambulatory Visit: Payer: Medicare (Managed Care)

## 2024-01-29 ENCOUNTER — Ambulatory Visit
Admission: RE | Admit: 2024-01-29 | Discharge: 2024-01-29 | Disposition: A | Discharge status: Home / Self Care | Payer: Medicare (Managed Care) | Source: Ambulatory Visit | Attending: Family Medicine | Admitting: Family Medicine

## 2024-01-29 DIAGNOSIS — N644 Mastodynia: Secondary | ICD-10-CM

## 2024-12-29 ENCOUNTER — Ambulatory Visit: Payer: Medicare (Managed Care) | Admitting: Neurology
